# Patient Record
Sex: Female | Born: 1955 | Race: White | Hispanic: No | Marital: Married | Smoking: Never smoker
Health system: Southern US, Community
[De-identification: ages and names within clinical notes are randomized; demographics above are authoritative.]

## PROBLEM LIST (undated history)

## (undated) DIAGNOSIS — K219 Gastro-esophageal reflux disease without esophagitis: Secondary | ICD-10-CM

## (undated) DIAGNOSIS — E611 Iron deficiency: Secondary | ICD-10-CM

## (undated) DIAGNOSIS — C801 Malignant (primary) neoplasm, unspecified: Secondary | ICD-10-CM

## (undated) DIAGNOSIS — I10 Essential (primary) hypertension: Secondary | ICD-10-CM

## (undated) DIAGNOSIS — E119 Type 2 diabetes mellitus without complications: Secondary | ICD-10-CM

## (undated) DIAGNOSIS — M1990 Unspecified osteoarthritis, unspecified site: Secondary | ICD-10-CM

## (undated) HISTORY — DX: Gastro-esophageal reflux disease without esophagitis: K21.9

## (undated) HISTORY — PX: BIOPSY ENDOMETRIAL: PRO11

## (undated) HISTORY — PX: BREAST BIOPSY: SHX20

## (undated) HISTORY — DX: Essential (primary) hypertension: I10

## (undated) HISTORY — PX: CRYOTHERAPY: SHX1416

## (undated) HISTORY — DX: Unspecified osteoarthritis, unspecified site: M19.90

## (undated) HISTORY — DX: Iron deficiency: E61.1

## (undated) HISTORY — PX: DILATION AND CURETTAGE OF UTERUS: SHX78

## (undated) HISTORY — PX: ABDOMINAL HYSTERECTOMY: SHX81

## (undated) HISTORY — PX: NASAL SINUS SURGERY: SHX719

## (undated) HISTORY — PX: KNEE SURGERY: SHX244

## (undated) HISTORY — DX: Type 2 diabetes mellitus without complications: E11.9

---

## 2006-05-01 ENCOUNTER — Emergency Department: Payer: Self-pay

## 2006-12-13 ENCOUNTER — Ambulatory Visit: Payer: Self-pay

## 2007-01-12 ENCOUNTER — Ambulatory Visit: Payer: Self-pay | Admitting: Unknown Physician Specialty

## 2007-01-16 ENCOUNTER — Ambulatory Visit: Payer: Self-pay | Admitting: Unknown Physician Specialty

## 2007-06-18 ENCOUNTER — Ambulatory Visit: Payer: Self-pay | Admitting: Gastroenterology

## 2007-07-14 ENCOUNTER — Ambulatory Visit: Payer: Self-pay | Admitting: Emergency Medicine

## 2010-04-24 ENCOUNTER — Ambulatory Visit: Payer: Self-pay | Admitting: Internal Medicine

## 2010-08-26 ENCOUNTER — Ambulatory Visit: Payer: Self-pay | Admitting: Otolaryngology

## 2012-01-10 ENCOUNTER — Ambulatory Visit: Payer: Self-pay | Admitting: Family Medicine

## 2012-08-07 ENCOUNTER — Ambulatory Visit: Payer: Self-pay | Admitting: Family Medicine

## 2012-08-16 ENCOUNTER — Ambulatory Visit: Payer: Self-pay | Admitting: Emergency Medicine

## 2012-12-07 ENCOUNTER — Ambulatory Visit: Payer: Self-pay | Admitting: Internal Medicine

## 2013-10-12 ENCOUNTER — Ambulatory Visit: Payer: Self-pay | Admitting: Unknown Physician Specialty

## 2013-10-29 DIAGNOSIS — M23229 Derangement of posterior horn of medial meniscus due to old tear or injury, unspecified knee: Secondary | ICD-10-CM | POA: Insufficient documentation

## 2013-10-29 HISTORY — DX: Derangement of posterior horn of medial meniscus due to old tear or injury, unspecified knee: M23.229

## 2013-11-22 DIAGNOSIS — I10 Essential (primary) hypertension: Secondary | ICD-10-CM | POA: Insufficient documentation

## 2013-11-22 HISTORY — DX: Essential (primary) hypertension: I10

## 2014-03-15 ENCOUNTER — Emergency Department: Payer: Self-pay | Admitting: Student

## 2014-04-11 ENCOUNTER — Ambulatory Visit: Payer: Self-pay | Admitting: Family Medicine

## 2014-09-02 DIAGNOSIS — Z86006 Personal history of melanoma in-situ: Secondary | ICD-10-CM

## 2014-09-02 HISTORY — DX: Personal history of melanoma in-situ: Z86.006

## 2014-11-21 DIAGNOSIS — Z85820 Personal history of malignant melanoma of skin: Secondary | ICD-10-CM | POA: Insufficient documentation

## 2014-11-21 HISTORY — DX: Personal history of malignant melanoma of skin: Z85.820

## 2015-01-26 DIAGNOSIS — Z86018 Personal history of other benign neoplasm: Secondary | ICD-10-CM

## 2015-01-26 HISTORY — DX: Personal history of other benign neoplasm: Z86.018

## 2015-03-06 DIAGNOSIS — K222 Esophageal obstruction: Secondary | ICD-10-CM

## 2015-03-06 HISTORY — DX: Esophageal obstruction: K22.2

## 2015-07-22 IMAGING — CR DG KNEE COMPLETE 4+V*L*
1 series · 4 of 4 positions shown · non-contrast
Comparison: None.

CLINICAL DATA: Left knee pain after fall.

EXAM:
LEFT KNEE - COMPLETE 4+ VIEW

[Series 1: t knee ap left · 0.14mm/px · 4 of 4 slices shown]
[im 1/4]
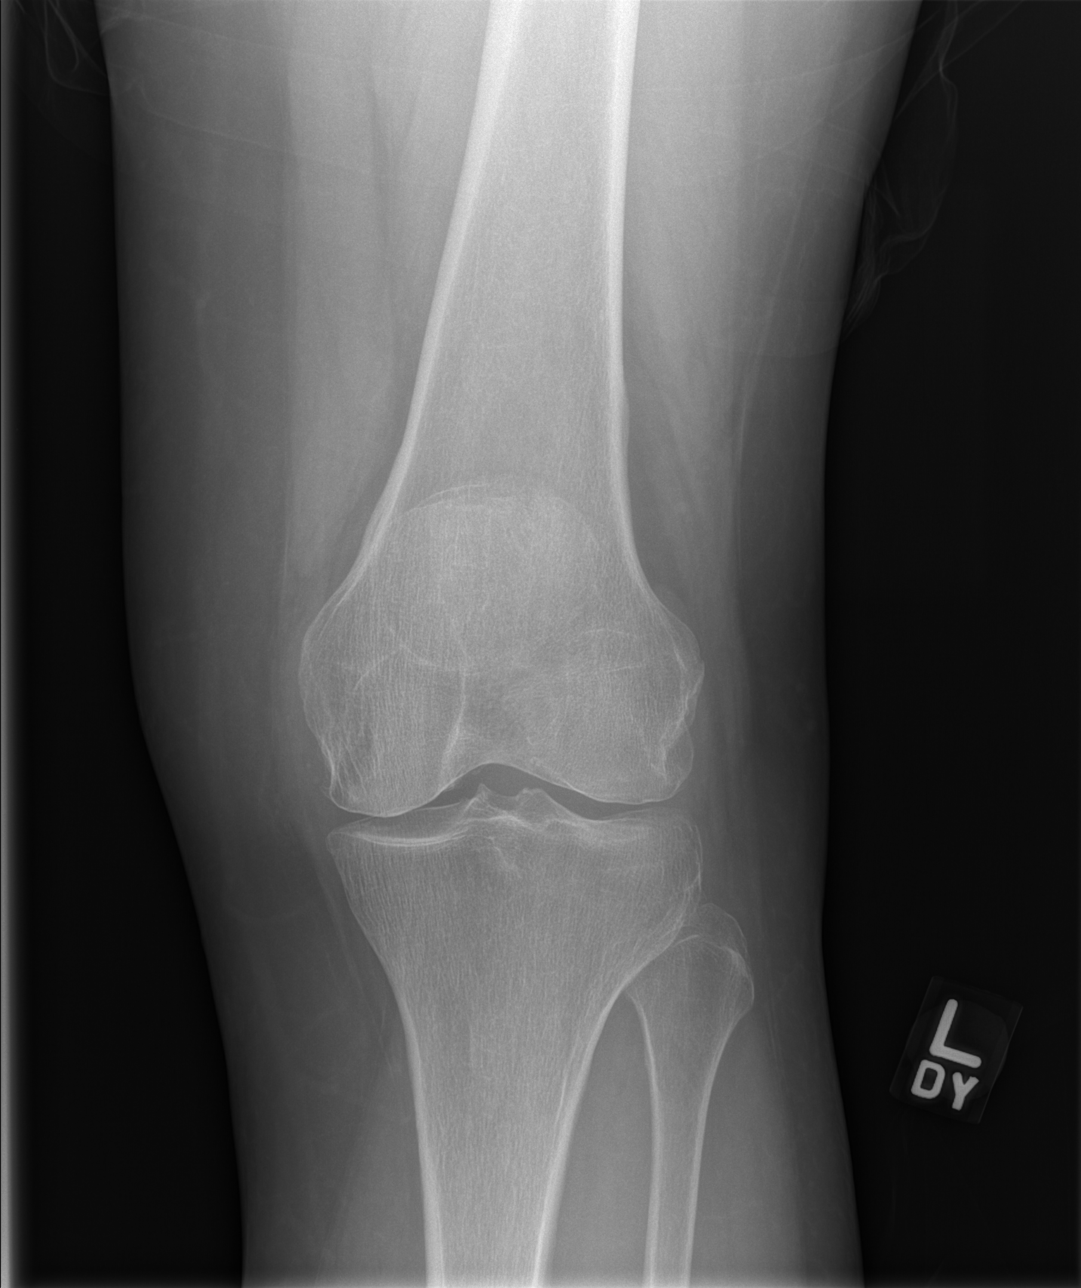
[im 2/4]
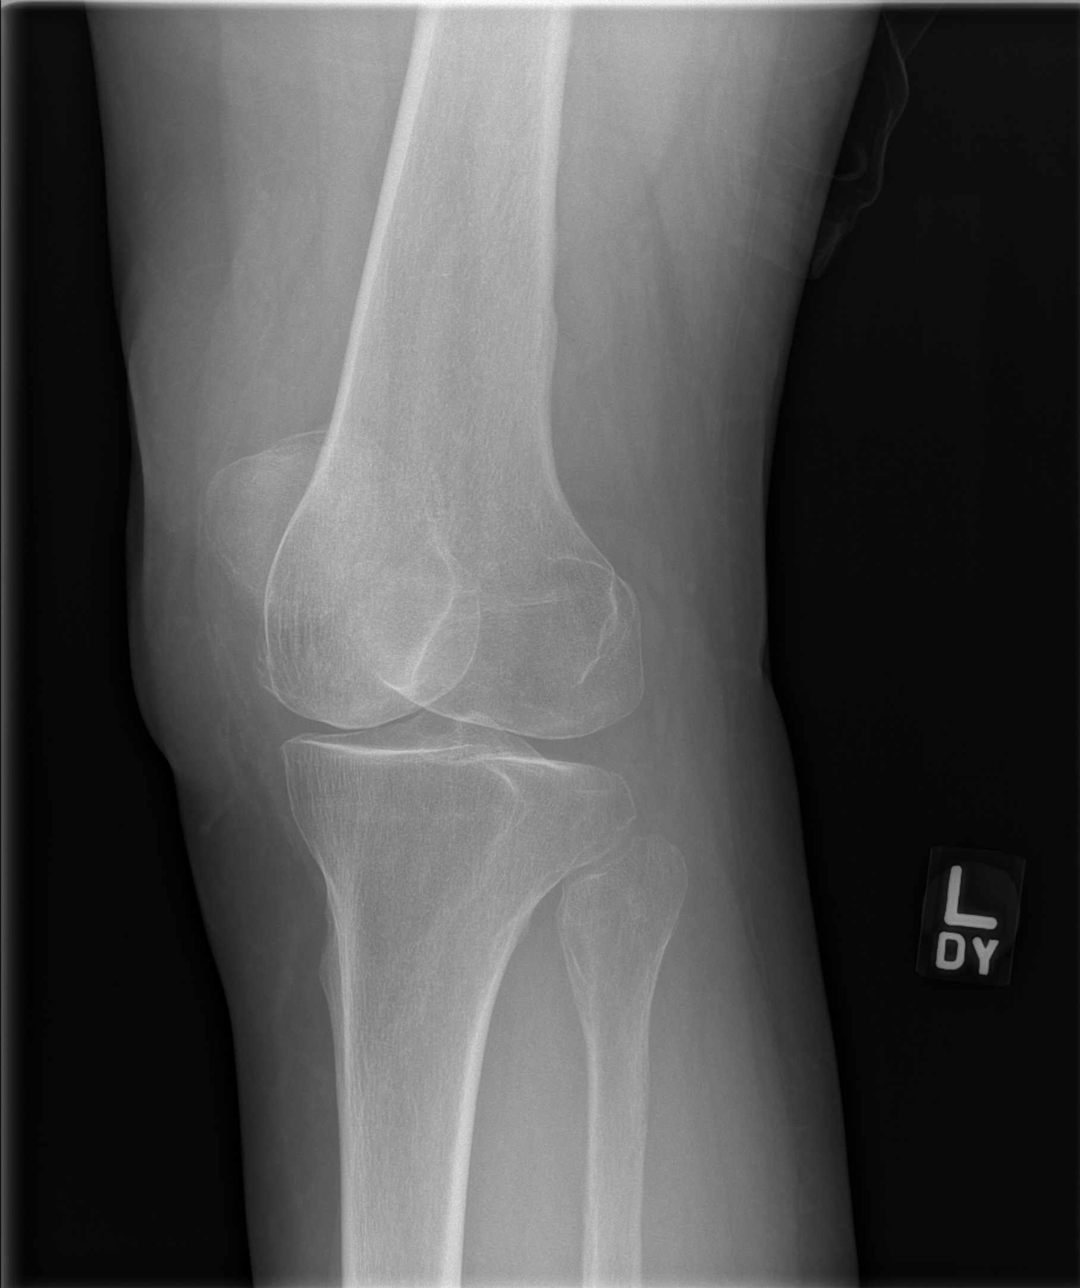
[im 3/4]
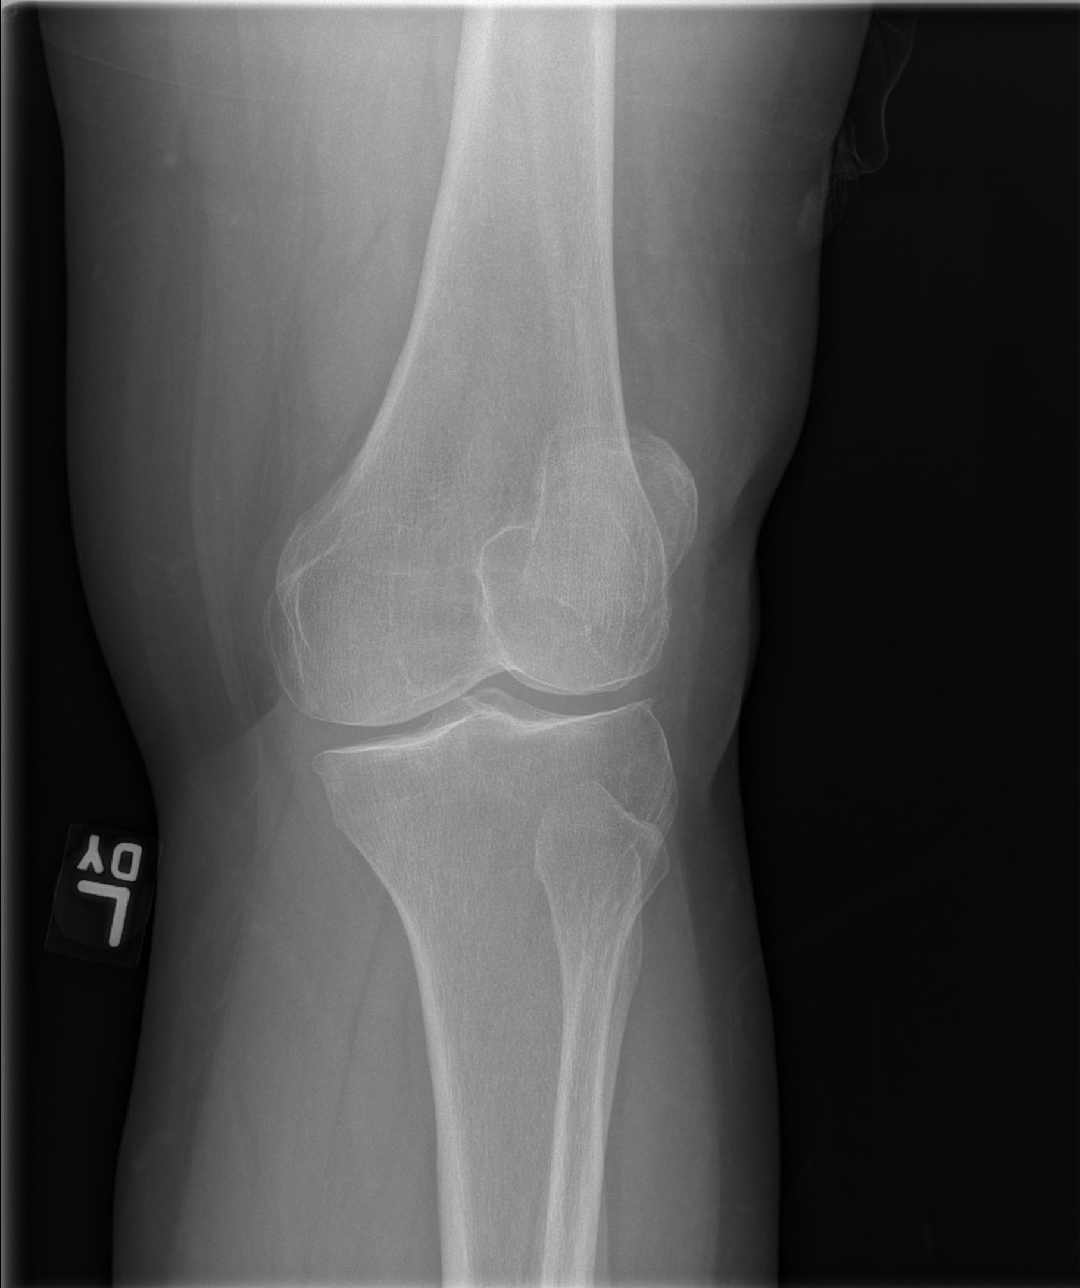
[im 4/4]
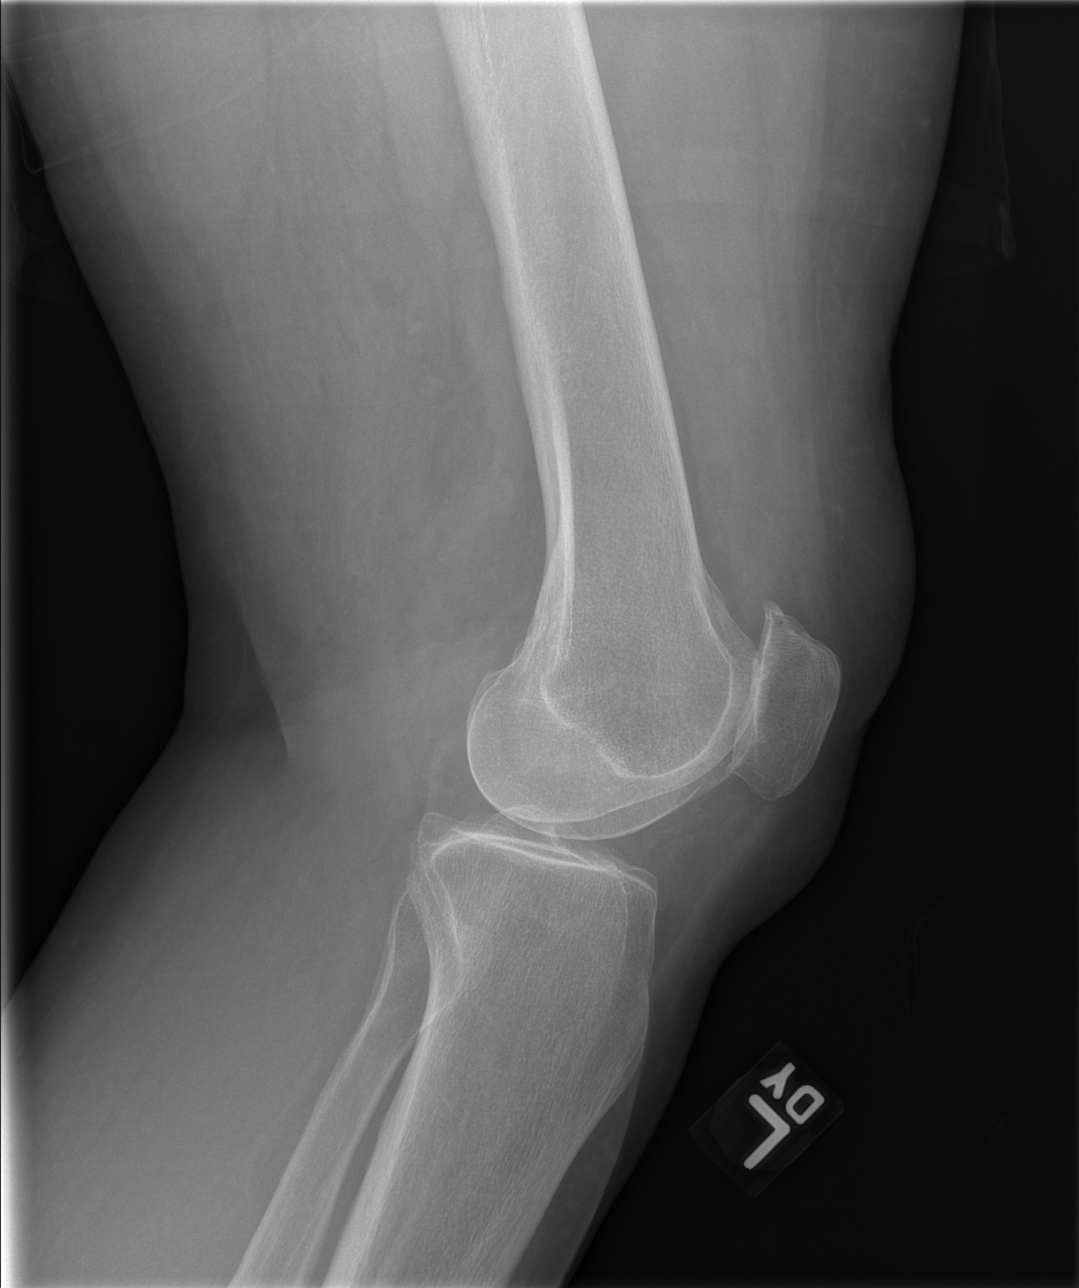

[4 of 4 positions shown; findings below may reference images not displayed]

FINDINGS: There is no evidence of fracture, dislocation, or joint effusion.
Mild narrowing of medial joint space is noted. Soft tissues are
unremarkable.
IMPRESSION: Mild degenerative joint disease is noted medially. No acute
abnormality seen in the left knee.

## 2015-12-15 DIAGNOSIS — E119 Type 2 diabetes mellitus without complications: Secondary | ICD-10-CM

## 2015-12-15 HISTORY — DX: Type 2 diabetes mellitus without complications: E11.9

## 2017-05-23 ENCOUNTER — Other Ambulatory Visit: Payer: Self-pay | Admitting: Family Medicine

## 2017-05-23 DIAGNOSIS — Z1231 Encounter for screening mammogram for malignant neoplasm of breast: Secondary | ICD-10-CM

## 2017-05-24 ENCOUNTER — Encounter: Payer: Self-pay | Admitting: Obstetrics & Gynecology

## 2017-05-24 ENCOUNTER — Ambulatory Visit (INDEPENDENT_AMBULATORY_CARE_PROVIDER_SITE_OTHER): Payer: PRIVATE HEALTH INSURANCE | Admitting: Obstetrics & Gynecology

## 2017-05-24 ENCOUNTER — Telehealth: Payer: Self-pay | Admitting: Obstetrics & Gynecology

## 2017-05-24 VITALS — BP 130/80 | HR 78 | Ht 63.0 in | Wt 201.0 lb

## 2017-05-24 DIAGNOSIS — Z124 Encounter for screening for malignant neoplasm of cervix: Secondary | ICD-10-CM

## 2017-05-24 DIAGNOSIS — Z1231 Encounter for screening mammogram for malignant neoplasm of breast: Secondary | ICD-10-CM | POA: Diagnosis not present

## 2017-05-24 DIAGNOSIS — N9089 Other specified noninflammatory disorders of vulva and perineum: Secondary | ICD-10-CM

## 2017-05-24 DIAGNOSIS — Z0000 Encounter for general adult medical examination without abnormal findings: Secondary | ICD-10-CM | POA: Diagnosis not present

## 2017-05-24 DIAGNOSIS — Z1239 Encounter for other screening for malignant neoplasm of breast: Secondary | ICD-10-CM

## 2017-05-24 NOTE — Patient Instructions (Signed)
PAP every three years Mammogram every year    Call [phone] to schedule at [facility] or [place] Colonoscopy every 10 years Labs yearly (with PCP)   Vulva Biopsy, Care After These instructions give you information about caring for yourself after your procedure. Your doctor may also give you more specific instructions. Call your doctor if you have any problems or questions after your procedure. Follow these instructions at home: Biopsy Site Care   Do not rub the biopsy area after peeing (urinating). Gently: ? Pat the area dry. Or, use a bottle filled with warm water (peri-bottle) to clean the area. ? Wipe from front to back.  Follow instructions from your doctor about how to take care of your biopsy site. Make sure you: ? Clean the area using water and mild soap twice a day or as told by your doctor. Gently pat the area dry. ? If you were prescribed an antibiotic medical ointment, apply it as told by your doctor. Do not stop using the antibiotic even if your condition gets better. ? Take a warm water bath that is taken while you are sitting down (sitz bath). Do this as needed to help with pain. ? Leave stitches (sutures), skin glue, or skin tape (adhesive) strips in place. They may need to stay in place for 2 weeks or longer. If tape strips get loose and curl up, you may trim the loose edges. Do not remove tape strips completely unless your doctor says it is okay.  Check your biopsy site every day for signs of infection. Check for: ? More redness, swelling, or pain. ? More fluid or blood. ? Warmth. ? Pus or a bad smell. Lifestyle  Wear loose, cotton underwear.  Do not wear tight pants.  Do not use a tampon, douche, or put anything in your vagina for at least one week or until your doctor says it is okay.  Do not have sex for at least one week or until your doctor says it is okay.  Do not exercise until your doctor says it is okay.  Do not take baths, swim, or use a hot tub  until your doctor says it is okay. General instructions  Take over-the-counter and prescription medicines only as told by your doctor.  Use a sanitary pad until bleeding stops.  Keep all follow-up visits as told by your doctor. This is important.  If the sample is being sent for testing, it is your responsibility to get the results of your procedure. Ask your doctor or the department doing the procedure when your results will be ready. Contact a doctor if:  You have more redness, swelling, or pain around your biopsy site.  You have more fluid or blood coming from your biopsy site.  Your biopsy site feels warm when you touch it.  Medicine does not help your pain. Get help right away if:  You have a lot of bleeding from the vulva.  You have pus or a bad smell coming from your biopsy site.  You have a fever.  You have lower belly pain. This information is not intended to replace advice given to you by your health care provider. Make sure you discuss any questions you have with your health care provider. Document Released: 09/02/2008 Document Revised: 11/12/2015 Document Reviewed: 04/27/2015 Elsevier Interactive Patient Education  [employer].

## 2017-05-24 NOTE — Progress Notes (Signed)
HPI:      Lindsay Wallace is a 61 y.o. G2P1011 who LMP was in the past (menopause and prior [facility]), she presents today for her annual examination.  The patient has no complaints today other than vulvar irritation and itching. The patient is not sexually active. [name] pap: approximate date 2016 and was normal and last mammogram: approximate date 2017 and was normal.  The patient does perform self breast exams.  There is no notable family history of breast or ovarian cancer in her family. The patient is not taking hormone replacement therapy. Patient denies post-menopausal vaginal bleeding.   The patient has regular exercise: yes. The patient denies current symptoms of depression.    Pt has sx's og vulvar irritation and sensitivity at times.  No VB.  Prior [facility].  GYN Hx: Last Colonoscopy:10 years ago. Normal.  Last DEXA: 4 years ago.    PMHx: Past Medical History:  Diagnosis Date  . Arthritis   . Diabetes mellitus without complication ([place])   . Esophageal reflux   . Hypertension   . Iron deficiency   . Iron deficiency    Past Surgical History:  Procedure Laterality Date  . ABDOMINAL HYSTERECTOMY    . BIOPSY ENDOMETRIAL    . CESAREAN SECTION    . CRYOTHERAPY    . DILATION AND CURETTAGE OF UTERUS    . KNEE SURGERY    . NASAL SINUS SURGERY     Family History  Problem Relation Age of Onset  . COPD Mother   . Osteoporosis Mother   . Emphysema Father   . Brain cancer Sister   . Lung cancer Sister   . Heart attack Sister   . Osteoporosis Sister   . Brain cancer Brother   . Lung cancer Brother   . Osteoporosis Brother   . Osteoporosis Maternal Aunt    Social History   Tobacco Use  . Smoking status: Never Smoker  . Smokeless tobacco: Never Used  Substance Use Topics  . Alcohol use: No    Frequency: Never  . Drug use: No   No current outpatient medications on file. Allergies: Codeine; Hydrocodone; Other; Bupropion; Eggs or egg-derived products; and  Moxifloxacin  Review of Systems  Constitutional: Negative for chills, fever and malaise/fatigue.  HENT: Negative for congestion, sinus pain and sore throat.   Eyes: Negative for blurred vision and pain.  Respiratory: Negative for cough and wheezing.   Cardiovascular: Negative for chest pain and leg swelling.  Gastrointestinal: Negative for abdominal pain, constipation, diarrhea, heartburn, nausea and vomiting.  Genitourinary: Negative for dysuria, frequency, hematuria and urgency.  Musculoskeletal: Negative for back pain, joint pain, myalgias and neck pain.  Skin: Negative for itching and rash.  Neurological: Negative for dizziness, tremors and weakness.  Endo/Heme/Allergies: Does not bruise/bleed easily.  Psychiatric/Behavioral: Negative for depression. The patient is not nervous/anxious and does not have insomnia.     Objective: BP 130/80   Pulse 78   Ht 5\' 3"  (1.6 m)   Wt 201 lb (91.2 kg)   BMI 35.61 kg/m   Filed Weights   05/24/17 1407  Weight: 201 lb (91.2 kg)   Body mass index is 35.61 kg/m. Physical Exam  Constitutional: She is oriented to person, place, and time. She appears well-developed and well-nourished. No distress.  Genitourinary: Rectum normal and vagina normal. Pelvic exam was performed with patient supine. There is no rash or lesion on the right labia. There is no rash or lesion on the left labia.  Vagina exhibits no lesion. No bleeding in the vagina. Right adnexum does not display mass and does not display tenderness. Left adnexum does not display mass and does not display tenderness. Cervix does not exhibit motion tenderness, lesion, friability or polyp.  Genitourinary Comments: Absent uterus Ext Vulvar pale while skin, no excoriations  HENT:  Head: Normocephalic and atraumatic. Head is without laceration.  Right Ear: Hearing normal.  Left Ear: Hearing normal.  Nose: No epistaxis.  No foreign bodies.  Mouth/Throat: Uvula is midline, oropharynx is clear  and moist and mucous membranes are normal.  Eyes: Pupils are equal, round, and reactive to light.  Neck: Normal range of motion. Neck supple. No thyromegaly present.  Cardiovascular: Normal rate and regular rhythm. Exam reveals no gallop and no friction rub.  No murmur heard. Pulmonary/Chest: Effort normal and breath sounds normal. No respiratory distress. She has no wheezes. Right breast exhibits no mass, no skin change and no tenderness. Left breast exhibits no mass, no skin change and no tenderness.  Abdominal: Soft. Bowel sounds are normal. She exhibits no distension. There is no tenderness. There is no rebound.  Musculoskeletal: Normal range of motion.  Neurological: She is alert and oriented to person, place, and time. No cranial nerve deficit.  Skin: Skin is warm and dry.  Psychiatric: She has a normal mood and affect. Judgment normal.  Vitals reviewed.   Assessment: Annual Exam 1. Annual physical exam   2. Vulvar lesion   3. Screening for cervical cancer   4. Screening for breast cancer    VULVAR BIOPSY NOTE The indications for vulvar biopsy (rule out neoplasia, establish lichen sclerosus diagnosis) were reviewed.   Risks of the biopsy including pain, bleeding, infection, inadequate specimen, scarring and need for additional procedures  were discussed. The patient stated understanding and agreed to undergo procedure today. Consent was signed,  time out performed.   The patient's vulva was prepped with Betadine. 1% lidocaine was injected into area of concern. A 3 -mm punch biopsy was done, biopsy tissue was picked up with sterile forceps and sterile scissors were used to excise the lesion.  Small bleeding was noted and hemostasis was achieved using silver nitrate sticks.  The patient tolerated the procedure well. Post-procedure instructions  (pelvic rest for one week) were given to the patient. The patient is to call with heavy bleeding, fever greater than 100.4, foul smelling vaginal  discharge or other concerns.   Plan:            1.  Cervical Screening-  Pap smear done today, Pap smear schedule reviewed with patient  2. Breast screening- Exam annually and mammogram scheduled (already scheduled at [place])  3. Colonoscopy every 10 years and will schedule soon with Dr Allen Norris, Hemoccult testing after age 89  4. Labs managed by PCP  5. Counseling for hormonal therapy: none, no change in therapy today  6. Vulvar skin changes, possible Lichen, VIN, Pagets.  Biopsy done.     F/U  Return in about 1 year (around 05/24/2018) for Annual.  Barnett Applebaum, MD, [facility], [facility] 05/24/2017  2:48 PM

## 2017-05-24 NOTE — Telephone Encounter (Signed)
Pt is calling about her the medication she is suppose to use for her Biopsy. Please advise

## 2017-05-25 ENCOUNTER — Telehealth: Payer: Self-pay | Admitting: Obstetrics & Gynecology

## 2017-05-25 NOTE — Telephone Encounter (Signed)
Patient doesn't want to wait for the biopsy results for you to come back from vacation.  Can someone else give her the results?

## 2017-05-25 NOTE — Telephone Encounter (Signed)
Yes, if it is not back when I check tomorrow then it can be forwarded to another doctor to give her results

## 2017-05-26 LAB — PATHOLOGY

## 2017-05-26 LAB — IGP, APTIMA HPV
HPV APTIMA: NEGATIVE
PAP Smear Comment: 0

## 2017-06-01 ENCOUNTER — Encounter: Payer: Self-pay | Admitting: Radiology

## 2017-06-01 ENCOUNTER — Ambulatory Visit
Admission: RE | Admit: 2017-06-01 | Discharge: 2017-06-01 | Disposition: A | Discharge status: Home / Self Care | Payer: PRIVATE HEALTH INSURANCE | Source: Ambulatory Visit | Attending: Family Medicine | Admitting: Family Medicine

## 2017-06-01 DIAGNOSIS — Z1231 Encounter for screening mammogram for malignant neoplasm of breast: Secondary | ICD-10-CM | POA: Diagnosis not present

## 2017-06-01 DIAGNOSIS — R928 Other abnormal and inconclusive findings on diagnostic imaging of breast: Secondary | ICD-10-CM | POA: Diagnosis not present

## 2017-06-01 HISTORY — DX: Malignant (primary) neoplasm, unspecified: C80.1

## 2017-06-05 ENCOUNTER — Telehealth: Payer: Self-pay | Admitting: Obstetrics & Gynecology

## 2017-06-05 NOTE — Progress Notes (Signed)
LM.  Pt to call back, discuss results then of Lichen Sclerosus.

## 2017-06-05 NOTE — Progress Notes (Signed)
Pt had called; I called back and no answer again. LM

## 2017-06-05 NOTE — Telephone Encounter (Signed)
Pt is returning missed call from Dr. Kenton Kingfisher. Please advise

## 2017-06-06 ENCOUNTER — Other Ambulatory Visit: Payer: Self-pay | Admitting: Obstetrics & Gynecology

## 2017-06-06 MED ORDER — CLOBETASOL PROPIONATE 0.05 % EX CREA: 1.0000 "application " | TOPICAL_CREAM | Freq: Two times a day (BID) | CUTANEOUS | 1 refills | Status: DC

## 2017-06-06 NOTE — Telephone Encounter (Signed)
Gae Dry, MD  Georgana Curio E        Please schedule appt in one month for follow up [facility]); pt awaiting call    Called and schedule patient 07/07/16 with Dr. Kenton Kingfisher for follow up

## 2017-06-06 NOTE — Progress Notes (Signed)
Please schedule appt in one month for follow up [facility]); pt awaiting call

## 2017-06-19 ENCOUNTER — Inpatient Hospital Stay
Admission: RE | Admit: 2017-06-19 | Discharge: 2017-06-19 | Disposition: A | Discharge status: Home / Self Care | Payer: Self-pay | Source: Ambulatory Visit | Attending: *Deleted | Admitting: *Deleted

## 2017-06-19 ENCOUNTER — Other Ambulatory Visit: Payer: Self-pay | Admitting: *Deleted

## 2017-06-19 DIAGNOSIS — Z9289 Personal history of other medical treatment: Secondary | ICD-10-CM

## 2017-06-22 ENCOUNTER — Other Ambulatory Visit: Payer: Self-pay | Admitting: Family Medicine

## 2017-06-22 DIAGNOSIS — N6489 Other specified disorders of breast: Secondary | ICD-10-CM

## 2017-06-22 DIAGNOSIS — R928 Other abnormal and inconclusive findings on diagnostic imaging of breast: Secondary | ICD-10-CM

## 2017-06-23 ENCOUNTER — Other Ambulatory Visit: Payer: Self-pay | Admitting: Obstetrics & Gynecology

## 2017-06-23 MED ORDER — CLOBETASOL PROPIONATE 0.05 % EX CREA: 1.0000 "application " | TOPICAL_CREAM | Freq: Two times a day (BID) | CUTANEOUS | 1 refills | Status: DC

## 2017-06-28 ENCOUNTER — Other Ambulatory Visit: Payer: Self-pay | Admitting: Obstetrics & Gynecology

## 2017-06-28 MED ORDER — MOMETASONE FUROATE 0.1 % EX OINT: TOPICAL_OINTMENT | CUTANEOUS | 3 refills | Status: DC

## 2017-06-29 ENCOUNTER — Ambulatory Visit
Admission: RE | Admit: 2017-06-29 | Discharge: 2017-06-29 | Disposition: A | Discharge status: Home / Self Care | Payer: PRIVATE HEALTH INSURANCE | Source: Ambulatory Visit | Attending: Family Medicine | Admitting: Family Medicine

## 2017-06-29 DIAGNOSIS — N6489 Other specified disorders of breast: Secondary | ICD-10-CM | POA: Diagnosis present

## 2017-06-29 DIAGNOSIS — R928 Other abnormal and inconclusive findings on diagnostic imaging of breast: Secondary | ICD-10-CM

## 2017-07-07 ENCOUNTER — Ambulatory Visit: Payer: PRIVATE HEALTH INSURANCE | Admitting: Obstetrics & Gynecology

## 2017-07-26 ENCOUNTER — Ambulatory Visit: Payer: PRIVATE HEALTH INSURANCE | Admitting: Obstetrics & Gynecology

## 2017-08-15 ENCOUNTER — Ambulatory Visit: Payer: PRIVATE HEALTH INSURANCE | Admitting: Obstetrics & Gynecology

## 2017-09-21 ENCOUNTER — Ambulatory Visit (INDEPENDENT_AMBULATORY_CARE_PROVIDER_SITE_OTHER): Payer: PRIVATE HEALTH INSURANCE | Admitting: Obstetrics & Gynecology

## 2017-09-21 ENCOUNTER — Encounter: Payer: Self-pay | Admitting: Obstetrics & Gynecology

## 2017-09-21 VITALS — BP 100/60 | Ht 63.0 in | Wt 199.0 lb

## 2017-09-21 DIAGNOSIS — L900 Lichen sclerosus et atrophicus: Secondary | ICD-10-CM

## 2017-09-21 NOTE — Progress Notes (Signed)
  History of Present Illness:  Lindsay Wallace is a 62 y.o. who was started on steroid cream to vulva due to dx of Lichen Sclerosus by exam and bx approximately 3 months ago. Since that time, she states that her symptoms are improving.  She denies dryness and itching.  She is not sexually active.  She has been using the cream daily.  PMHx: She  has a past medical history of Arthritis, Cancer ([place]), Diabetes mellitus without complication ([place]), Esophageal reflux, Hypertension, Iron deficiency, and Iron deficiency. Also,  has a past surgical history that includes Knee surgery; Cesarean section; Dilation and curettage of uterus; Biopsy endometrial; Cryotherapy; Abdominal hysterectomy; Nasal sinus surgery; and Breast biopsy (Left)., family history includes Brain cancer in her brother and sister; COPD in her mother; Emphysema in her father; Heart attack in her sister; Lung cancer in her brother and sister; Osteoporosis in her brother, maternal aunt, mother, and sister.,  reports that she has never smoked. She has never used smokeless tobacco. She reports that she does not drink alcohol or use drugs.  Current Outpatient Medications:  .  mometasone (ELOCON) 0.1 % ointment, Apply topical to affected areas twice daily for 2 weeks per episode, Disp: 45 g, Rfl: 3  Also, is allergic to codeine; hydrocodone; other; bupropion; eggs or egg-derived products; and moxifloxacin..  Review of Systems  All other systems reviewed and are negative.  Physical Exam:  BP 100/60   Ht 5\' 3"  (1.6 m)   Wt 199 lb (90.3 kg)   BMI 35.25 kg/m  Body mass index is 35.25 kg/m. Physical Exam  Constitutional: She is oriented to person, place, and time. She appears well-developed and well-nourished. No distress.  Genitourinary: Pelvic exam was performed with patient supine. There is no rash, tenderness or lesion on the right labia. There is no rash, tenderness or lesion on the left labia. Vagina exhibits no lesion. No erythema or  bleeding in the vagina.  Genitourinary Comments: Pale vulva, no lesions Vag atrophy noted  Abdominal: Soft. She exhibits no distension. There is no tenderness.  Musculoskeletal: Normal range of motion.  Neurological: She is alert and oriented to person, place, and time. No cranial nerve deficit.  Skin: Skin is warm and dry.  Psychiatric: She has a normal mood and affect.   Assessment:  Problem List Items Addressed This Visit      Musculoskeletal and Integument   Lichen sclerosus - Primary     Medication treatment is going well for her Lichen.  Plan: She will undergo change in her medical therapy.  She will stop medicine and use when she has flare ups only; unless flare ups are frequent.  She will keep diary of sx's.  Pros and cons of chronic therapy with steroids discussed.  She was amenable to this plan and we will see her back for annual/PRN.  Barnett Applebaum, MD, [facility], [facility] 09/21/2017  9:18 AM

## 2018-09-14 ENCOUNTER — Ambulatory Visit: Payer: Self-pay | Admitting: Obstetrics & Gynecology

## 2019-04-03 DIAGNOSIS — E7800 Pure hypercholesterolemia, unspecified: Secondary | ICD-10-CM | POA: Insufficient documentation

## 2019-04-03 HISTORY — DX: Pure hypercholesterolemia, unspecified: E78.00

## 2020-05-01 ENCOUNTER — Telehealth: Payer: Self-pay

## 2020-05-01 ENCOUNTER — Other Ambulatory Visit: Payer: Self-pay | Admitting: Obstetrics & Gynecology

## 2020-05-01 DIAGNOSIS — Z1231 Encounter for screening mammogram for malignant neoplasm of breast: Secondary | ICD-10-CM

## 2020-05-01 NOTE — Telephone Encounter (Signed)
Patient has apt for AE w/RPH 06/2020. Requesting order for mammogram so she can schedule apt. GC#[phone]

## 2020-05-01 NOTE — Telephone Encounter (Signed)
Done (overdue for MMG as well as Annual)

## 2020-05-01 NOTE — Telephone Encounter (Signed)
LMVM to notify patient order placed.

## 2020-06-24 ENCOUNTER — Other Ambulatory Visit: Payer: Self-pay

## 2020-06-24 ENCOUNTER — Ambulatory Visit (INDEPENDENT_AMBULATORY_CARE_PROVIDER_SITE_OTHER): Payer: PRIVATE HEALTH INSURANCE | Admitting: Obstetrics & Gynecology

## 2020-06-24 ENCOUNTER — Encounter: Payer: Self-pay | Admitting: Obstetrics & Gynecology

## 2020-06-24 ENCOUNTER — Other Ambulatory Visit (HOSPITAL_COMMUNITY)
Admission: RE | Admit: 2020-06-24 | Discharge: 2020-06-24 | Disposition: A | Discharge status: Home / Self Care | Payer: PRIVATE HEALTH INSURANCE | Source: Ambulatory Visit | Attending: Obstetrics & Gynecology | Admitting: Obstetrics & Gynecology

## 2020-06-24 VITALS — BP 122/62 | HR 85 | Ht 63.0 in | Wt 205.8 lb

## 2020-06-24 DIAGNOSIS — Z124 Encounter for screening for malignant neoplasm of cervix: Secondary | ICD-10-CM

## 2020-06-24 DIAGNOSIS — L900 Lichen sclerosus et atrophicus: Secondary | ICD-10-CM

## 2020-06-24 DIAGNOSIS — Z01419 Encounter for gynecological examination (general) (routine) without abnormal findings: Secondary | ICD-10-CM

## 2020-06-24 DIAGNOSIS — Z1211 Encounter for screening for malignant neoplasm of colon: Secondary | ICD-10-CM | POA: Insufficient documentation

## 2020-06-24 DIAGNOSIS — Z1231 Encounter for screening mammogram for malignant neoplasm of breast: Secondary | ICD-10-CM

## 2020-06-24 MED ORDER — CLOBETASOL PROPIONATE 0.05 % EX OINT: TOPICAL_OINTMENT | CUTANEOUS | 5 refills | Status: DC

## 2020-06-24 NOTE — Patient Instructions (Addendum)
PAP every three years Mammogram every year    Call [phone] to schedule at [facility] Colonoscopy every 10 years or Cologuard every 3 years Labs yearly (with PCP)  Thank you for choosing [facility]. As part of our ongoing efforts to improve patient experience, we would appreciate your feedback. Please fill out the short survey that you will receive by mail or MyChart. Your opinion is important to [place]! - Dr. Tiburcio Pea

## 2020-06-24 NOTE — Progress Notes (Signed)
HPI:      [name] is a 65 y.o. G2P1011 who LMP was in the past, she presents today for her annual examination.  The patient has no complaints today.  No exacerbations w LS.  Prior [facility].   The patient is not currently sexually active. [name] pap: approximate date 2018 and was normal and last mammogram: approximate date 2019 and was normal.  The patient does perform self breast exams.  There is no notable family history of breast or ovarian cancer in her family. The patient is not taking hormone replacement therapy. Patient denies post-menopausal vaginal bleeding.   The patient has regular exercise: yes. The patient denies current symptoms of depression.    GYN Hx: Last Colonoscopy:10 years ago. Normal. Planning Cologuard thru PCP Last DEXA: several years ago.    PMHx: Past Medical History:  Diagnosis Date  . Arthritis   . Cancer ([facility])    skin ca  . Diabetes mellitus without complication ([facility])   . Esophageal reflux   . Hypertension   . Iron deficiency   . Iron deficiency    Past Surgical History:  Procedure Laterality Date  . ABDOMINAL HYSTERECTOMY    . BIOPSY ENDOMETRIAL    . BREAST BIOPSY Left    needle bx-neg  . CESAREAN SECTION    . CRYOTHERAPY    . DILATION AND CURETTAGE OF UTERUS    . KNEE SURGERY    . NASAL SINUS SURGERY     Family History  Problem Relation Age of Onset  . COPD Mother   . Osteoporosis Mother   . Emphysema Father   . Brain cancer Sister   . Lung cancer Sister   . Heart attack Sister   . Osteoporosis Sister   . Brain cancer Brother   . Lung cancer Brother   . Osteoporosis Brother   . Osteoporosis Maternal Aunt   . Breast cancer Neg Hx    Social History   Tobacco Use  . Smoking status: Never Smoker  . Smokeless tobacco: Never Used  Substance Use Topics  . Alcohol use: No  . Drug use: No    Current Outpatient Medications:  .  clobetasol ointment (TEMOVATE) 0.05 %, Apply to affected area every night for 2 weeks as needed for  symptoms related to lichen, Disp: 30 g, Rfl: 5 .  mometasone (ELOCON) 0.1 % ointment, Apply topical to affected areas twice daily for 2 weeks per episode, Disp: 45 g, Rfl: 3 .  pravastatin (PRAVACHOL) 10 MG tablet, Take 10 mg by mouth daily., Disp: , Rfl:  Allergies: Codeine, Hydrocodone, Other, Bupropion, Eggs or egg-derived products, and Moxifloxacin  Review of Systems  Constitutional: Negative for chills, fever and malaise/fatigue.  HENT: Negative for congestion, sinus pain and sore throat.   Eyes: Negative for blurred vision and pain.  Respiratory: Negative for cough and wheezing.   Cardiovascular: Negative for chest pain and leg swelling.  Gastrointestinal: Negative for abdominal pain, constipation, diarrhea, heartburn, nausea and vomiting.  Genitourinary: Negative for dysuria, frequency, hematuria and urgency.  Musculoskeletal: Positive for joint pain. Negative for back pain, myalgias and neck pain.  Skin: Negative for itching and rash.  Neurological: Negative for dizziness, tremors and weakness.  Endo/Heme/Allergies: Does not bruise/bleed easily.  Psychiatric/Behavioral: Positive for depression. The patient is nervous/anxious. The patient does not have insomnia.     Objective: BP 122/62   Pulse 85   Ht 5\' 3"  (1.6 m)   Wt 205 lb 12.8 oz (93.4 kg)  SpO2 96%   BMI 36.46 kg/m   Filed Weights   06/24/20 0801  Weight: 205 lb 12.8 oz (93.4 kg)   Body mass index is 36.46 kg/m. Physical Exam Constitutional:      General: She is not in acute distress.    Appearance: She is well-developed and well-nourished.  Genitourinary:     Vagina and uterus normal.     There is no rash or lesion on the right labia.     There is no rash or lesion on the left labia.    No lesions in the vagina.     Vulva exam comments: Mild effects of LS around vag introisus.        No vaginal bleeding.      Right Adnexa: not tender and no mass present.    Left Adnexa: not tender and no mass present.     No cervical motion tenderness, friability, lesion or polyp.     Uterus is mobile.     Uterus is absent and midaxial.     Pelvic exam was performed with patient supine.  Breasts:     Right: No mass, skin change or tenderness.     Left: No mass, skin change or tenderness.    HENT:     Head: Normocephalic and atraumatic. No laceration.     Right Ear: Hearing normal.     Left Ear: Hearing normal.     Nose: No epistaxis or foreign body.     Mouth/Throat:     Mouth: Oropharynx is clear and moist and mucous membranes are normal.     Pharynx: Uvula midline.  Eyes:     Pupils: Pupils are equal, round, and reactive to light.  Neck:     Thyroid: No thyromegaly.  Cardiovascular:     Rate and Rhythm: Normal rate and regular rhythm.     Heart sounds: No murmur heard. No friction rub. No gallop.   Pulmonary:     Effort: Pulmonary effort is normal. No respiratory distress.     Breath sounds: Normal breath sounds. No wheezing.  Abdominal:     General: Bowel sounds are normal. There is no distension.     Palpations: Abdomen is soft.     Tenderness: There is no abdominal tenderness. There is no rebound.  Musculoskeletal:        General: Normal range of motion.     Cervical back: Normal range of motion and neck supple.  Neurological:     Mental Status: She is alert and oriented to person, place, and time.     Cranial Nerves: No cranial nerve deficit.  Skin:    General: Skin is warm and dry.  Psychiatric:        Mood and Affect: Mood and affect normal.        Judgment: Judgment normal.  Vitals reviewed.     Assessment: Annual Exam 1. Women's annual routine gynecological examination   2. Screening for cervical cancer   3. Encounter for screening mammogram for malignant neoplasm of breast   4. Screen for colon cancer   5. Lichen sclerosus     Plan:            1.  Cervical Screening-  Pap smear done today  2. Breast screening- Exam annually and mammogram scheduled  3.  Colonoscopy every 10 years vs Cologuard (which she plans thru PCP) Hemoccult testing after age 89  4. Labs managed by PCP  5. Counseling for hormonal therapy: none  6. FRAX - FRAX score for assessing the 10 year probability for fracture calculated and discussed today.  Based on age and score today, DEXA is not currently scheduled.   7. Cont PRN use Clobetasol    F/U  Return in about 1 year (around 06/24/2021) for Annual.  Barnett Applebaum, MD, [facility], [facility] 06/24/2020  8:38 AM

## 2020-06-26 LAB — CYTOLOGY - PAP
Comment: NEGATIVE
Diagnosis: NEGATIVE
High risk HPV: NEGATIVE

## 2020-07-28 ENCOUNTER — Encounter: Payer: Self-pay | Admitting: Dietician

## 2020-07-31 ENCOUNTER — Telehealth: Payer: Self-pay

## 2020-07-31 NOTE — Telephone Encounter (Signed)
-----   Message from Gae Dry, MD sent at 07/31/2020  4:24 PM EST ----- Regarding: MMG!! Pt still has not had MMG done.  Please call and encourage her to do so, and even offer to help schedule the MMG if she feels this would help her.  The number is [phone] to schedule at [facility].  Do not let her hang up without a plan to have this done.  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  Thanks for the help.

## 2020-07-31 NOTE — Telephone Encounter (Signed)
Pt states she will schedule her mammo, she was waiting until after she had her covid vaccine, which was done in December

## 2020-08-11 ENCOUNTER — Other Ambulatory Visit: Payer: Self-pay

## 2020-08-11 ENCOUNTER — Ambulatory Visit (INDEPENDENT_AMBULATORY_CARE_PROVIDER_SITE_OTHER): Payer: PRIVATE HEALTH INSURANCE | Admitting: Dermatology

## 2020-08-11 DIAGNOSIS — D229 Melanocytic nevi, unspecified: Secondary | ICD-10-CM

## 2020-08-11 DIAGNOSIS — Z86006 Personal history of melanoma in-situ: Secondary | ICD-10-CM | POA: Diagnosis not present

## 2020-08-11 DIAGNOSIS — D1800 Hemangioma unspecified site: Secondary | ICD-10-CM

## 2020-08-11 DIAGNOSIS — Z86018 Personal history of other benign neoplasm: Secondary | ICD-10-CM

## 2020-08-11 DIAGNOSIS — L821 Other seborrheic keratosis: Secondary | ICD-10-CM

## 2020-08-11 DIAGNOSIS — L570 Actinic keratosis: Secondary | ICD-10-CM

## 2020-08-11 DIAGNOSIS — L659 Nonscarring hair loss, unspecified: Secondary | ICD-10-CM | POA: Diagnosis not present

## 2020-08-11 DIAGNOSIS — L578 Other skin changes due to chronic exposure to nonionizing radiation: Secondary | ICD-10-CM

## 2020-08-11 DIAGNOSIS — L814 Other melanin hyperpigmentation: Secondary | ICD-10-CM

## 2020-08-11 DIAGNOSIS — Z1283 Encounter for screening for malignant neoplasm of skin: Secondary | ICD-10-CM | POA: Diagnosis not present

## 2020-08-11 NOTE — Progress Notes (Signed)
Follow-Up Visit   Subjective  Lindsay Wallace is a 65 y.o. female who presents for the following: TBSE (Patient here for full body skin exam and skin cancer screening. Patient with hx of MMis and dysplastic nevi. ).  Also with concerns about hair loss over last couple of years which is bothersome. She has noticed increased loss and thinner hair.   The following portions of the chart were reviewed this encounter and updated as appropriate:   Tobacco  Allergies  Meds  Problems  Med Hx  Surg Hx  Fam Hx      Review of Systems:  No other skin or systemic complaints except as noted in HPI or Assessment and Plan.  Objective  Well appearing patient in no apparent distress; mood and affect are within normal limits.  A full examination was performed including scalp, head, eyes, ears, nose, lips, neck, chest, axillae, abdomen, back, buttocks, bilateral upper extremities, bilateral lower extremities, hands, feet, fingers, toes, fingernails, and toenails. All findings within normal limits unless otherwise noted below.  Objective  left jaw x 1: Erythematous thin papules/macules with gritty scale.   Objective  Scalp: Diffuse thinning of the crown and widening of the midline part with retention of the frontal hairline  Images     Assessment & Plan  AK (actinic keratosis) left jaw x 1  Prior to procedure, discussed risks of blister formation, small wound, skin dyspigmentation, or rare scar following cryotherapy.    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 Condition is bothersome to patient. Currently  flared.  Patient advises her iron is low and she does take iron and Vitamin D. Patient had labs done with PCP but results are not in Epic.   Recommend over the counter Rogaine (minoxidil) 5% twice a day. Reviewed risk of initial increased hair shedding and facial hypertrichosis. Advised she must continue using it to maintain results.  Discussed oral treatment with prescription low dose finasteride. Pt defers at this time.   Lentigines - Scattered tan macules - Discussed due to sun exposure - Benign, observe - Call for any changes  Seborrheic Keratoses - Stuck-on, waxy, tan-brown papules and plaques  - Discussed benign etiology and prognosis. - Observe - Call for any changes  Melanocytic Nevi - Tan-brown and/or pink-flesh-colored symmetric macules and papules - Benign appearing on exam today - Observation - Call clinic for new or changing moles - Recommend daily use of broad spectrum spf 30+ sunscreen to sun-exposed areas.   Hemangiomas - Red papules - Discussed benign nature - Observe - Call for any changes  Actinic Damage - Chronic, secondary to cumulative UV/sun exposure - diffuse scaly erythematous macules with underlying dyspigmentation - Recommend daily broad spectrum sunscreen SPF 30+ to sun-exposed areas, reapply every 2 hours as needed.  - Call for new or changing lesions.  Skin cancer screening performed today.  History of Melanoma in Situ - No evidence of recurrence today at left lower abdomen - Recommend regular full body skin exams - Recommend daily broad spectrum sunscreen SPF 30+ to sun-exposed areas, reapply every 2 hours as needed.  - Call if any new or changing lesions  are noted between office visits  History of Dysplastic Nevi - No evidence of recurrence today, all have been mild - Recommend regular full body skin exams - Recommend daily broad spectrum sunscreen SPF 30+ to sun-exposed areas, reapply every 2 hours as needed.  - Call if any new or  changing lesions are noted between office visits  Return in about 1 year (around 08/11/2021) for TBSE; 3-4 months recheck AK and hair loss.  Graciella Belton, RMA, am acting as scribe for Forest Gleason, MD .  Documentation: I have reviewed the above documentation for accuracy and completeness, and I agree with the above.  Forest Gleason, MD

## 2020-08-11 NOTE — Patient Instructions (Addendum)
Melanoma ABCDEs  Melanoma is the most dangerous type of skin cancer, and is the leading cause of death from skin disease.  You are more likely to develop melanoma if you:  Have light-colored skin, light-colored eyes, or red or blond hair  Spend a lot of time in the sun  Tan regularly, either outdoors or in a tanning bed  Have had blistering sunburns, especially during childhood  Have a close family member who has had a melanoma  Have atypical moles or large birthmarks  Early detection of melanoma is key since treatment is typically straightforward and cure rates are extremely high if we catch it early.   The first sign of melanoma is often a change in a mole or a new dark spot.  The ABCDE system is a way of remembering the signs of melanoma.  A for asymmetry:  The two halves do not match. B for border:  The edges of the growth are irregular. C for color:  A mixture of colors are present instead of an even brown color. D for diameter:  Melanomas are usually (but not always) greater than 22mm - the size of a pencil eraser. E for evolution:  The spot keeps changing in size, shape, and color.  Please check your skin once per month between visits. You can use a small mirror in front and a large mirror behind you to keep an eye on the back side or your body.   If you see any new or changing lesions before your next follow-up, please call to schedule a visit.  Please continue daily skin protection including broad spectrum sunscreen SPF 30+ to sun-exposed areas, reapplying every 2 hours as needed when you're outdoors.    Cryotherapy Aftercare  . Wash gently with soap and water everyday.   Marland Kitchen Apply Vaseline and Band-Aid daily until healed.  Prior to procedure, discussed risks of blister formation, small wound, skin dyspigmentation, or rare scar following cryotherapy.    Recommend taking Heliocare sun protection supplement daily in sunny weather for additional sun protection. For maximum  protection on the sunniest days, you can take up to 2 capsules of regular Heliocare OR take 1 capsule of Heliocare Ultra. For prolonged exposure (such as a full day in the sun), you can repeat your dose of the supplement 4 hours after your first dose. Heliocare can be purchased at [facility] or at [email or web address].

## 2020-08-26 ENCOUNTER — Other Ambulatory Visit: Payer: Self-pay | Admitting: Obstetrics & Gynecology

## 2020-08-26 ENCOUNTER — Telehealth: Payer: Self-pay

## 2020-08-26 NOTE — Telephone Encounter (Signed)
Pt states she will schedule her mammogram  

## 2020-08-26 NOTE — Telephone Encounter (Signed)
-----   Message from Gae Dry, MD sent at 08/26/2020  1:23 PM EST ----- Regarding: MMG Received notice she has not received MMG yet as ordered at her Annual. Please check and encourage her to do this, and document conversation.

## 2020-10-28 ENCOUNTER — Ambulatory Visit: Payer: PRIVATE HEALTH INSURANCE | Admitting: Dermatology

## 2020-11-12 ENCOUNTER — Ambulatory Visit: Payer: PRIVATE HEALTH INSURANCE | Admitting: Dermatology

## 2020-11-27 ENCOUNTER — Telehealth: Payer: PRIVATE HEALTH INSURANCE

## 2020-11-27 NOTE — Telephone Encounter (Signed)
-----   Message from Gae Dry, MD sent at 11/26/2020 10:21 AM EDT ----- Regarding: MMG Pt still has not had MMG done.  Please call and encourage her to do so, and even offer to help schedule the MMG if she feels this would help her.  The number is [phone] [facility].  Do not let her hang up without a plan to have this done.  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  Thanks for the help.

## 2020-11-27 NOTE — Telephone Encounter (Signed)
Called pt left a voice mail do call [facility] back we needed to get her scheduled for her MAMMOGRAM.

## 2021-02-28 ENCOUNTER — Other Ambulatory Visit: Payer: Self-pay | Admitting: Obstetrics & Gynecology

## 2021-02-28 DIAGNOSIS — Z1231 Encounter for screening mammogram for malignant neoplasm of breast: Secondary | ICD-10-CM

## 2021-07-17 ENCOUNTER — Ambulatory Visit
Admission: EM | Admit: 2021-07-17 | Discharge: 2021-07-17 | Disposition: A | Discharge status: Home / Self Care | Payer: PRIVATE HEALTH INSURANCE | Attending: Family Medicine | Admitting: Family Medicine

## 2021-07-17 ENCOUNTER — Other Ambulatory Visit: Payer: Self-pay

## 2021-07-17 DIAGNOSIS — J0100 Acute maxillary sinusitis, unspecified: Secondary | ICD-10-CM

## 2021-07-17 MED ORDER — DOXYCYCLINE HYCLATE 100 MG PO CAPS: 100.0000 mg | ORAL_CAPSULE | Freq: Two times a day (BID) | ORAL | 0 refills | Status: DC

## 2021-07-17 MED ORDER — ONDANSETRON 4 MG PO TBDP: 4.0000 mg | ORAL_TABLET | Freq: Three times a day (TID) | ORAL | 0 refills | Status: DC | PRN

## 2021-07-17 NOTE — ED Triage Notes (Signed)
Pt here with C/O sinus/ cold for 1 week, woke up yesterday morning vomiting that has since resolved. Woke up this morning with right ear pain.

## 2021-07-17 NOTE — ED Provider Notes (Signed)
[facility]    CSN: [id number] Arrival date & time: 07/17/21  0849      History   Chief Complaint Chief Complaint  Patient presents with   Otalgia    HPI  66 year old female presents with above complaint.  Patient reports that she has been sick since Monday.  She has had sinus pain and pressure particular on the right side of her face.  She also reports ear pain as of this morning.  She had a recent bout of nausea, vomiting, and diarrhea and this has now resolved.  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  She believes that she got sick from daycare.  No fever.  No other associated symptoms.  No other complaints  Past Medical History:  Diagnosis Date   Arthritis    Cancer ([place])    skin ca   Diabetes mellitus without complication ([place])    Esophageal reflux    History of dysplastic nevus 01/26/2015   left mid buttocks/mild   History of dysplastic nevus 02/18/2015   right prox post thigh/mild, left thigh/mild, left medial pretibial/mild   History of dysplastic nevus 10/26/2016   right med distal thigh above knee/mild   History of melanoma in situ 09/02/2014   left lower abdomen   Hypertension    Iron deficiency    Iron deficiency     Patient Active Problem List   Diagnosis Date Noted   Lichen sclerosus 73/41/9379    Past Surgical History:  Procedure Laterality Date   ABDOMINAL HYSTERECTOMY     BIOPSY ENDOMETRIAL     BREAST BIOPSY Left    needle bx-neg   CESAREAN SECTION     CRYOTHERAPY     DILATION AND CURETTAGE OF UTERUS     KNEE SURGERY     NASAL SINUS SURGERY      OB History     Gravida  2   Para  1   Term  1   Preterm      AB  1   Living  1      SAB  1   IAB      Ectopic      Multiple      Live Births               Home Medications    Prior to Admission medications   Medication Sig Start Date End Date Taking? Authorizing Provider  busPIRone (BUSPAR) 5 MG tablet Take 1 tablet by mouth 2 (two) times daily.  05/19/21  Yes [provider]  cetirizine (ZYRTEC) 10 MG tablet Take 1 tablet by mouth daily. 05/19/21  Yes [provider]  doxycycline (VIBRAMYCIN) 100 MG capsule Take 1 capsule (100 mg total) by mouth 2 (two) times daily. 07/17/21  Yes Jhaniya Briski G, DO  lisinopril (ZESTRIL) 20 MG tablet Take by mouth. 05/19/21  Yes [provider]  metFORMIN (GLUCOPHAGE) 500 MG tablet Take by mouth. 05/19/21  Yes [provider]  omeprazole (PRILOSEC) 20 MG capsule Take by mouth. 05/19/21  Yes [provider]  ondansetron (ZOFRAN-ODT) 4 MG disintegrating tablet Take 1 tablet (4 mg total) by mouth every 8 (eight) hours as needed for nausea or vomiting. 07/17/21  Yes America Sandall G, DO  pravastatin (PRAVACHOL) 10 MG tablet Take 10 mg by mouth daily.   Yes [provider]  sertraline (ZOLOFT) 100 MG tablet Take by mouth. 05/19/21  Yes [provider]    Family History Family History  Problem Relation Age of Onset   COPD Mother    Osteoporosis Mother    Emphysema Father    Brain cancer Sister    Lung cancer Sister    Heart attack Sister    Osteoporosis Sister    Brain cancer Brother    Lung cancer Brother    Osteoporosis Brother    Osteoporosis Maternal Aunt    Breast cancer Neg Hx     Social History Social History   Tobacco Use   Smoking status: Never   Smokeless tobacco: Never  Substance Use Topics   Alcohol use: No   Drug use: No     Allergies   Codeine, Hydrocodone, Other, Bupropion, Eggs or egg-derived products, and Moxifloxacin   Review of Systems Review of Systems Per HPI  Physical Exam Triage Vital Signs ED Triage Vitals  Enc Vitals Group     BP 07/17/21 0900 105/75     Pulse Rate 07/17/21 0900 92     Resp 07/17/21 0900 18     Temp 07/17/21 0900 98.9 F (37.2 C)     Temp Source 07/17/21 0900 Oral     SpO2 07/17/21 0900 97 %     Weight 07/17/21 0858 199 lb (90.3 kg)     Height 07/17/21 0858 5\' 3"  (1.6 m)      Head Circumference --      Peak Flow --      Pain Score 07/17/21 0858 8     Pain Loc --      Pain Edu? --      Excl. in [place]? --    No data found.  Updated Vital Signs BP 105/75 (BP Location: Left Arm)    Pulse 92    Temp 98.9 F (37.2 C) (Oral)    Resp 18    Ht 5\' 3"  (1.6 m)    Wt 90.3 kg    SpO2 97%    BMI 35.25 kg/m   Visual Acuity Right Eye Distance:   Left Eye Distance:   Bilateral Distance:    Right Eye Near:   Left Eye Near:    Bilateral Near:     Physical Exam Vitals and nursing note reviewed.  Constitutional:      General: She is not in acute distress.    Appearance: Normal appearance. She is not ill-appearing.  HENT:     Head: Normocephalic and atraumatic.     Right Ear: Tympanic membrane normal.     Left Ear: Tympanic membrane normal.     Nose:     Comments: Right maxillary sinus tenderness to palpation. Cardiovascular:     Rate and Rhythm: Normal rate and regular rhythm.  Pulmonary:     Effort: Pulmonary effort is normal.     Breath sounds: Normal breath sounds. No wheezing, rhonchi or rales.  Neurological:     Mental Status: She is alert.  Psychiatric:        Mood and Affect: Mood normal.        Behavior: Behavior normal.     UC Treatments / Results  Labs (all labs ordered are listed, but only abnormal results are displayed) Labs Reviewed - No data to display  EKG   Radiology No results found.  Procedures Procedures (including critical care time)  Medications Ordered in UC Medications - No data to display  Initial Impression / Assessment and Plan / UC Course  I have reviewed the triage vital signs and the nursing notes.  Pertinent labs & imaging results  that were available during my care of the patient were reviewed by me and considered in my medical decision making (see chart for details).    66 year old female presents with sinusitis.  Treating with doxycycline.  Zofran to be used as needed if she has any further issues with nausea  and/or vomiting.  Final Clinical Impressions(s) / UC Diagnoses   Final diagnoses:  Acute maxillary sinusitis, recurrence not specified     Discharge Instructions      Medication as prescribed.  Follow up with your PCP.  Take care  Dr. Lacinda Axon    ED Prescriptions     Medication Sig Dispense Auth. Provider   doxycycline (VIBRAMYCIN) 100 MG capsule Take 1 capsule (100 mg total) by mouth 2 (two) times daily. 14 capsule Reathel Turi G, DO   ondansetron (ZOFRAN-ODT) 4 MG disintegrating tablet Take 1 tablet (4 mg total) by mouth every 8 (eight) hours as needed for nausea or vomiting. 20 tablet Coral Spikes, DO      PDMP not reviewed this encounter.   Coral Spikes, DO 07/17/21 1011

## 2021-07-17 NOTE — Discharge Instructions (Signed)
Medication as prescribed.  Follow up with your PCP.  Take care  Dr. Kailene Steinhart  

## 2021-08-12 ENCOUNTER — Ambulatory Visit: Payer: PRIVATE HEALTH INSURANCE | Admitting: Dermatology

## 2021-09-10 ENCOUNTER — Other Ambulatory Visit: Payer: Self-pay

## 2021-09-10 ENCOUNTER — Ambulatory Visit
Admission: EM | Admit: 2021-09-10 | Discharge: 2021-09-10 | Disposition: A | Discharge status: Home / Self Care | Payer: PRIVATE HEALTH INSURANCE | Attending: Internal Medicine | Admitting: Internal Medicine

## 2021-09-10 ENCOUNTER — Encounter: Payer: Self-pay | Admitting: Emergency Medicine

## 2021-09-10 DIAGNOSIS — J029 Acute pharyngitis, unspecified: Secondary | ICD-10-CM | POA: Diagnosis present

## 2021-09-10 LAB — GROUP A STREP BY PCR: Group A Strep by PCR: NOT DETECTED

## 2021-09-10 MED ORDER — FLUTICASONE PROPIONATE 50 MCG/ACT NA SUSP: 2.0000 | Freq: Every day | NASAL | 0 refills | Status: AC

## 2021-09-10 NOTE — Discharge Instructions (Signed)
Your strep test was negative ?Try saline rinses called Netie pot to clean the post nasal drainage and you may also use Flonase at night time to help with that.  ?

## 2021-09-10 NOTE — ED Triage Notes (Signed)
Patient c/o sore throat off and on for couple of weeks.  Patient states that her grandkids tested positive for strep throat yesterday.  ?

## 2022-08-02 ENCOUNTER — Other Ambulatory Visit: Payer: Self-pay | Admitting: Family Medicine

## 2022-08-02 DIAGNOSIS — Z1231 Encounter for screening mammogram for malignant neoplasm of breast: Secondary | ICD-10-CM

## 2022-08-08 ENCOUNTER — Ambulatory Visit
Admission: RE | Admit: 2022-08-08 | Discharge: 2022-08-08 | Disposition: A | Discharge status: Home / Self Care | Payer: Medicare Other | Source: Ambulatory Visit | Attending: Family Medicine | Admitting: Family Medicine

## 2022-08-08 DIAGNOSIS — Z1231 Encounter for screening mammogram for malignant neoplasm of breast: Secondary | ICD-10-CM | POA: Insufficient documentation

## 2022-09-14 ENCOUNTER — Encounter: Payer: Self-pay | Admitting: Dermatology

## 2022-09-14 ENCOUNTER — Ambulatory Visit (INDEPENDENT_AMBULATORY_CARE_PROVIDER_SITE_OTHER): Payer: Medicare Other | Admitting: Dermatology

## 2022-09-14 DIAGNOSIS — L578 Other skin changes due to chronic exposure to nonionizing radiation: Secondary | ICD-10-CM | POA: Diagnosis not present

## 2022-09-14 DIAGNOSIS — Z86018 Personal history of other benign neoplasm: Secondary | ICD-10-CM

## 2022-09-14 DIAGNOSIS — D2262 Melanocytic nevi of left upper limb, including shoulder: Secondary | ICD-10-CM

## 2022-09-14 DIAGNOSIS — L814 Other melanin hyperpigmentation: Secondary | ICD-10-CM

## 2022-09-14 DIAGNOSIS — Z1283 Encounter for screening for malignant neoplasm of skin: Secondary | ICD-10-CM | POA: Diagnosis not present

## 2022-09-14 DIAGNOSIS — D229 Melanocytic nevi, unspecified: Secondary | ICD-10-CM

## 2022-09-14 DIAGNOSIS — L821 Other seborrheic keratosis: Secondary | ICD-10-CM

## 2022-09-14 DIAGNOSIS — R202 Paresthesia of skin: Secondary | ICD-10-CM

## 2022-09-14 DIAGNOSIS — L650 Telogen effluvium: Secondary | ICD-10-CM

## 2022-09-14 DIAGNOSIS — Z86006 Personal history of melanoma in-situ: Secondary | ICD-10-CM

## 2022-09-14 NOTE — Patient Instructions (Addendum)
Recommend OTC Gold Bond Rapid Relief Anti-Itch cream (pramoxine + menthol), CeraVe Anti-itch cream or lotion (pramoxine), Sarna lotion (Original- menthol + camphor or Sensitive- pramoxine) or Eucerin 12 hour Itch Relief lotion (menthol) up to 3 times per day to areas on body that are itchy.  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 Chronic rubbing or scratching causes darkening of the skin.  - OTC treatments which can help with itch include numbing creams like pramoxine or lidocaine which temporarily reduce itch or Capsaicin-containing creams which cause a burning sensation but which sometimes over time will reset the nerves to stop producing itch.  - If you choose to use Capsaicin cream, it is recommended to use it 5 times daily for 1 week followed by 3 times daily for 3-6 weeks. You may have to continue using it long-term.  - If not doing well with OTC options, could consider Skin Medicinals compounded prescription anti-itch cream with Amitriptyline 5% / Lidocaine 5% / Pramoxine 1% or Amitriptyline 5% / Gabapentin 10% / Lidocaine 5% Cream - For severe cases, there are some prescription cream or pill options which may help.  Recommend taking Heliocare sun protection supplement daily in sunny weather for additional sun protection. For maximum protection on the sunniest days, you can take up to 2 capsules of regular Heliocare OR take 1 capsule of Heliocare Ultra. For prolonged exposure (such as a full day in the sun), you can repeat your dose of the supplement 4 hours after your first dose. Heliocare can be purchased at [employer], at some [facility] or at [email or web address].    Melanoma ABCDEs  Melanoma is the most dangerous type of skin cancer, and is the leading cause of death from skin disease.  You are more likely to develop melanoma if  you: Have light-colored skin, light-colored eyes, or red or blond hair Spend a lot of time in the sun Tan regularly, either outdoors or in a tanning bed Have had blistering sunburns, especially during childhood Have a close family member who has had a melanoma Have atypical moles or large birthmarks  Early detection of melanoma is key since treatment is typically straightforward and cure rates are extremely high if we catch it early.   The first sign of melanoma is often a change in a mole or a new dark spot.  The ABCDE system is a way of remembering the signs of melanoma.  A for asymmetry:  The two halves do not match. B for border:  The edges of the growth are irregular. C for color:  A mixture of colors are present instead of an even brown color. D for diameter:  Melanomas are usually (but not always) greater than 63mm - the size of a pencil eraser. E for evolution:  The spot keeps changing in size, shape, and color.  Please check your skin once per month between visits. You can use a small mirror in front and a large mirror behind you to keep an eye on the back side or your body.   If you see any new or changing lesions before your next follow-up, please call to schedule a visit.  Please continue daily skin protection including broad spectrum sunscreen SPF 30+ to sun-exposed areas, reapplying every 2 hours as needed when you're outdoors.    Due to recent changes in healthcare laws, you may see results of your pathology  and/or laboratory studies on MyChart before the doctors have had a chance to review them. We understand that in some cases there may be results that are confusing or concerning to you. Please understand that not all results are received at the same time and often the doctors may need to interpret multiple results in order to provide you with the best plan of care or course of treatment. Therefore, we ask that you please give [place] 2 business days to thoroughly review all your  results before contacting the office for clarification. Should we see a critical lab result, you will be contacted sooner.   If You Need Anything After Your Visit  If you have any questions or concerns for your doctor, please call our main line at [phone] and press option 4 to reach your doctor's medical assistant. If no one answers, please leave a voicemail as directed and we will return your call as soon as possible. Messages left after 4 pm will be answered the following business day.   You may also send [place] a message via [place]. We typically respond to MyChart messages within 1-2 business days.  For prescription refills, please ask your pharmacy to contact our office. Our fax number is [phone].  If you have an urgent issue when the clinic is closed that cannot wait until the next business day, you can page your doctor at the number below.    Please note that while we do our best to be available for urgent issues outside of office hours, we are not available 24/7.   If you have an urgent issue and are unable to reach [place], you may choose to seek medical care at your doctor's office, retail clinic, urgent care center, or emergency room.  If you have a medical emergency, please immediately call 911 or go to the emergency department.  Pager Numbers  - Dr. Nehemiah Massed: [phone]  - Dr. Laurence Ferrari: [phone]  - Dr. Nicole Kindred: [phone]  In the event of inclement weather, please call our main line at [phone] for an update on the status of any delays or closures.  Dermatology Medication Tips: Please keep the boxes that topical medications come in in order to help keep track of the instructions about where and how to use these. Pharmacies typically print the medication instructions only on the boxes and not directly on the medication tubes.   If your medication is too expensive, please contact our office at [phone] option 4 or send [place] a message through [address].   We are  unable to tell what your co-pay for medications will be in advance as this is different depending on your insurance coverage. However, we may be able to find a substitute medication at lower cost or fill out paperwork to get insurance to cover a needed medication.   If a prior authorization is required to get your medication covered by your insurance company, please allow [place] 1-2 business days to complete this process.  Drug prices often vary depending on where the prescription is filled and some pharmacies may offer cheaper prices.  The website www.goodrx.com contains coupons for medications through different pharmacies. The prices here do not account for what the cost may be with help from insurance (it may be cheaper with your insurance), but the website can give you the price if you did not use any insurance.  - You can print the associated coupon and take it with your prescription to the pharmacy.  - You may also stop by our  office during regular business hours and pick up a GoodRx coupon card.  - If you need your prescription sent electronically to a different pharmacy, notify our office through [facility] or by phone at [phone] option 4.     Si Usted Necesita Algo Despus de Su Visita  Tambin puede enviarnos un mensaje a travs de Pharmacist, community. Por lo general respondemos a los mensajes de MyChart en el transcurso de 1 a 2 das hbiles.  Para renovar recetas, por favor pida a su farmacia que se ponga en contacto con nuestra oficina. Harland Dingwall de fax es [place] [phone].  Si tiene un asunto urgente cuando la clnica est cerrada y que no puede esperar hasta el siguiente da hbil, puede llamar/localizar a su doctor(a) al nmero que aparece a continuacin.   Por favor, tenga en cuenta que aunque hacemos todo lo posible para estar disponibles para asuntos urgentes fuera del horario de [place], no estamos disponibles las 24 horas del da, los 7 das de la [place].   Si tiene un  problema urgente y no puede comunicarse con nosotros, puede optar por buscar atencin mdica  en el consultorio de su doctor(a), en una clnica privada, en un centro de atencin urgente o en una sala de emergencias.  Si tiene Engineering geologist, por favor llame inmediatamente al 911 o vaya a la sala de emergencias.  Nmeros de bper  - Dr. Nehemiah Massed: [phone]  - Dra. Moye: [phone]  - Dra. Nicole Kindred: [phone]  En caso de inclemencias del [place], por favor llame a Johnsie Kindred principal al [phone] para una actualizacin sobre el [place] de cualquier retraso o cierre.  Consejos para la medicacin en dermatologa: Por favor, guarde las cajas en las que vienen los medicamentos de uso tpico para ayudarle a seguir las instrucciones sobre dnde y cmo usarlos. Las farmacias generalmente imprimen las instrucciones del medicamento slo en las cajas y no directamente en los tubos del [place].   Si su medicamento es muy caro, por favor, pngase en contacto con Zigmund Daniel llamando al [phone] y presione la opcin 4 o envenos un mensaje a travs de Pharmacist, community.   No podemos decirle cul ser su copago por los medicamentos por adelantado ya que esto es diferente dependiendo de la cobertura de su seguro. Sin embargo, es posible que podamos encontrar un medicamento sustituto a Electrical engineer un formulario para que el seguro cubra el medicamento que se considera necesario.   Si se requiere una autorizacin previa para que su compaa de seguros [place] su medicamento, por favor permtanos de 1 a 2 das hbiles para completar este proceso.  Los precios de los medicamentos varan con frecuencia dependiendo del Environmental consultant de dnde se surte la receta y alguna farmacias pueden ofrecer precios ms baratos.  El sitio web www.goodrx.com tiene cupones para medicamentos de Airline pilot. Los precios aqu no tienen en cuenta lo que podra costar con la ayuda del seguro (puede ser ms  barato con su seguro), pero el sitio web puede darle el precio si no utiliz Research scientist (physical sciences).  - Puede imprimir el cupn correspondiente y llevarlo con su receta a la farmacia.  - Tambin puede pasar por nuestra oficina durante el horario de atencin regular y Charity fundraiser una tarjeta de cupones de GoodRx.  - Si necesita que su receta se enve electrnicamente a una farmacia diferente, informe a nuestra oficina a travs de MyChart de [place] o por telfono llamando al [phone] y presione la opcin 4.

## 2022-09-14 NOTE — Progress Notes (Signed)
Follow-Up Visit   Subjective  Lindsay Wallace is a 67 y.o. female who presents for the following: Skin Cancer Screening and Full Body Skin Exam  The patient presents for Total-Body Skin Exam (TBSE) for skin cancer screening and mole check. The patient has spots, moles and lesions to be evaluated, some may be new or changing and the patient has concerns that these could be cancer.  Hx dysplastic nevi, MMis. Patient does have a spot at right eyebrow.   The following portions of the chart were reviewed this encounter and updated as appropriate: medications, allergies, medical history  Review of Systems:  No other skin or systemic complaints except as noted in HPI or Assessment and Plan.  Objective  Well appearing patient in no apparent distress; mood and affect are within normal limits.  A full examination was performed including scalp, head, eyes, ears, nose, lips, neck, chest, axillae, abdomen, back, buttocks, bilateral upper extremities, bilateral lower extremities, hands, feet, fingers, toes, fingernails, and toenails. All findings within normal limits unless otherwise noted below.   Relevant physical exam findings are noted in the Assessment and Plan.   Assessment & Plan   LENTIGINES, SEBORRHEIC KERATOSES, HEMANGIOMAS - Benign normal skin lesions - Benign-appearing - Call for any changes  MELANOCYTIC NEVI - Tan-brown and/or pink-flesh-colored symmetric macules and papules - Benign appearing on exam today - Observation - Call clinic for new or changing moles - Recommend daily use of broad spectrum spf 30+ sunscreen to sun-exposed areas.   ACTINIC DAMAGE - Chronic condition, secondary to cumulative UV/sun exposure - diffuse scaly erythematous macules with underlying dyspigmentation - Recommend daily broad spectrum sunscreen SPF 30+ to sun-exposed areas, reapply every 2 hours as needed.  - Staying in the shade or wearing long sleeves, sun glasses (UVA+UVB protection) and wide  brim hats (4-inch brim around the entire circumference of the hat) are also recommended for sun protection.  - Call for new or changing lesions.  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 Chronic rubbing or scratching causes darkening of the skin.  OTC treatments which can help with itch include numbing creams like pramoxine or lidocaine which temporarily reduce itch or Capsaicin-containing creams which cause a burning sensation but which sometimes over time will reset the nerves to stop producing itch.  If you choose to use Capsaicin cream, it is recommended to use it 5 times daily for 1 week followed by 3 times daily for 3-6 weeks. You may have to continue using it long-term.  If not doing well with OTC options, could consider Skin Medicinals compounded prescription anti-itch cream with Amitriptyline 5% / Lidocaine 5% / Pramoxine 1% or Amitriptyline 5% / Gabapentin 10% / Lidocaine 5% Cream or other prescription cream or pill options.   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  Telogen effluvium is a benign, self-limited condition causing increased hair shedding usually for several months. It does not progress to baldness, and the hair eventually grows back on its own. It can be triggered by recent illness, recent surgery, thyroid disease, low iron stores, vitamin D deficiency, fad diets or rapid weight loss, hormonal changes such as pregnancy or birth control pills, and some medication. Usually the hair loss starts 2-3 months after the illness or health change. Rarely, it can continue for longer than a year. Treatments options may include oral or topical Minoxidil; Red  Light scalp treatments; Biotin 2.5 mg daily and other options.  Treatment Plan: Recommend OTC minoxidil. Patient has been on Ozempic and advises hair loss  has started to slow down.   SKIN CANCER SCREENING PERFORMED TODAY.  History of Dysplastic Nevi - No evidence of recurrence today - Recommend regular full body skin exams - Recommend daily broad spectrum sunscreen SPF 30+ to sun-exposed areas, reapply every 2 hours as needed.  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  - Call if any new or changing lesions are noted between office visits  MELANOCYTIC NEVI Benign appearing on exam today. Recommend observation. Call clinic for new or changing moles. Recommend daily use of broad spectrum spf 30+ sunscreen to sun-exposed areas.  Exam 0.7 cm brown macule at left palm with parallel furrow pattern       Return for 2-3 month nevus follow up, 1 year FBSE , Hx MMis, Hx Dysplastic Nevi.  Graciella Belton, RMA, am acting as scribe for Forest Gleason, MD .   Documentation: I have reviewed the above documentation for accuracy and completeness, and I agree with the above.  Forest Gleason, MD

## 2022-10-06 DIAGNOSIS — M25551 Pain in right hip: Secondary | ICD-10-CM

## 2022-10-06 HISTORY — DX: Pain in right hip: M25.551

## 2022-10-07 DIAGNOSIS — M179 Osteoarthritis of knee, unspecified: Secondary | ICD-10-CM

## 2022-10-07 HISTORY — DX: Osteoarthritis of knee, unspecified: M17.9

## 2022-12-01 ENCOUNTER — Ambulatory Visit: Payer: Medicare Other | Admitting: Dermatology

## 2023-04-18 ENCOUNTER — Encounter: Payer: Self-pay | Admitting: Dermatology

## 2023-04-18 ENCOUNTER — Ambulatory Visit: Payer: Medicare Other | Admitting: Dermatology

## 2023-04-18 DIAGNOSIS — R21 Rash and other nonspecific skin eruption: Secondary | ICD-10-CM

## 2023-04-18 DIAGNOSIS — L920 Granuloma annulare: Secondary | ICD-10-CM

## 2023-04-18 DIAGNOSIS — Z7189 Other specified counseling: Secondary | ICD-10-CM | POA: Diagnosis not present

## 2023-04-18 MED ORDER — TRIAMCINOLONE ACETONIDE 0.1 % EX CREA: TOPICAL_CREAM | CUTANEOUS | 2 refills | Status: AC

## 2023-04-18 NOTE — Progress Notes (Signed)
   Follow-Up Visit   Subjective  Lindsay Wallace is a 67 y.o. female who presents for the following: Spot on left hand. Dur: 3 months. Has been larger/more raised. Is now spreading. Non tender. Itches at times. Has not put anything on the area. Similar rash has come and gone for years  Patient states Dr. Neale Burly has injected lesions on her right elbow in the past. States they would come and go. Raised, irritated.   The patient has spots, moles and lesions to be evaluated, some may be new or changing and the patient may have concern these could be cancer.    The following portions of the chart were reviewed this encounter and updated as appropriate: medications, allergies, medical history  Review of Systems:  No other skin or systemic complaints except as noted in HPI or Assessment and Plan.  Objective  Well appearing patient in no apparent distress; mood and affect are within normal limits.  A focused examination was performed of the following areas: Left hand  Relevant physical exam findings are noted in the Assessment and Plan.           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 Its cause is not known, and most episodes of granuloma annulare clear up after a few years, with or without treatment.  Offered biopsy for confirmation. Patient opts for observation and topical treatment Start Triamcinolone 0.1% cream twice daily to affected areas as needed for itching. Avoid applying to face, groin, and axilla. Use as directed. Long-term use can cause thinning of the skin.  Topical steroids (such as triamcinolone,  fluocinolone, fluocinonide, mometasone, clobetasol, halobetasol, betamethasone, hydrocortisone) can cause thinning and lightening of the skin if they are used for too long in the same area. Your physician has selected the right strength medicine for your problem and area affected on the body. Please use your medication only as directed by your physician to prevent side effects.    Discussed biopsy to be more certain of diagnosis. Patient prefers to treat topically and recheck in 6 months. Will have Dr. Gwen Pounds recheck areas.   Return for TBSE As Scheduled, With Dr. Gwen Pounds.  I, Lawson Radar, CMA, am acting as scribe for Elie Goody, MD.   Documentation: I have reviewed the above documentation for accuracy and completeness, and I agree with the above.  Elie Goody, MD

## 2023-04-18 NOTE — Patient Instructions (Addendum)
Start Triamcinolone 0.1% cream twice daily to affected areas as needed for itching. Avoid applying to face, groin, and axilla. Use as directed. Long-term use can cause thinning of the skin.  Topical steroids (such as triamcinolone, fluocinolone, fluocinonide, mometasone, clobetasol, halobetasol, betamethasone, hydrocortisone) can cause thinning and lightening of the skin if they are used for too long in the same area. Your physician has selected the right strength medicine for your problem and area affected on the body. Please use your medication only as directed by your physician to prevent side effects.      Due to recent changes in healthcare laws, you may see results of your pathology and/or laboratory studies on MyChart before the doctors have had a chance to review them. We understand that in some cases there may be results that are confusing or concerning to you. Please understand that not all results are received at the same time and often the doctors may need to interpret multiple results in order to provide you with the best plan of care or course of treatment. Therefore, we ask that you please give [place] 2 business days to thoroughly review all your results before contacting the office for clarification. Should we see a critical lab result, you will be contacted sooner.   If You Need Anything After Your Visit  If you have any questions or concerns for your doctor, please call our main line at [phone] and press option 4 to reach your doctor's medical assistant. If no one answers, please leave a voicemail as directed and we will return your call as soon as possible. Messages left after 4 pm will be answered the following business day.   You may also send [place] a message via MyChart. We typically respond to MyChart messages within 1-2 business days.  For prescription refills, please ask your pharmacy to contact our office. Our fax number is [phone].  If you have an urgent issue when the  clinic is closed that cannot wait until the next business day, you can page your doctor at the number below.    Please note that while we do our best to be available for urgent issues outside of office hours, we are not available 24/7.   If you have an urgent issue and are unable to reach [place], you may choose to seek medical care at your doctor's office, retail clinic, urgent care center, or emergency room.  If you have a medical emergency, please immediately call 911 or go to the emergency department.  Pager Numbers  - Dr. Gwen Pounds: [phone]  - Dr. Roseanne Reno: [phone]  - Dr. Katrinka Blazing: [phone]   In the event of inclement weather, please call our main line at [phone] for an update on the status of any delays or closures.  Dermatology Medication Tips: Please keep the boxes that topical medications come in in order to help keep track of the instructions about where and how to use these. Pharmacies typically print the medication instructions only on the boxes and not directly on the medication tubes.   If your medication is too expensive, please contact our office at [phone] option 4 or send [place] a message through MyChart.   We are unable to tell what your co-pay for medications will be in advance as this is different depending on your insurance coverage. However, we may be able to find a substitute medication at lower cost or fill out paperwork to get insurance to cover a needed medication.   If a prior authorization is  required to get your medication covered by your insurance company, please allow [place] 1-2 business days to complete this process.  Drug prices often vary depending on where the prescription is filled and some pharmacies may offer cheaper prices.  The website www.goodrx.com contains coupons for medications through different pharmacies. The prices here do not account for what the cost may be with help from insurance (it may be cheaper with your insurance), but the  website can give you the price if you did not use any insurance.  - You can print the associated coupon and take it with your prescription to the pharmacy.  - You may also stop by our office during regular business hours and pick up a GoodRx coupon card.  - If you need your prescription sent electronically to a different pharmacy, notify our office through [facility] or by phone at [phone] option 4.     Si Usted Necesita Algo Despus de Su Visita  Tambin puede enviarnos un mensaje a travs de Clinical cytogeneticist. Por lo general respondemos a los mensajes de MyChart en el transcurso de 1 a 2 das hbiles.  Para renovar recetas, por favor pida a su farmacia que se ponga en contacto con nuestra oficina. Annie Sable de fax es [place] [phone].  Si tiene un asunto urgente cuando la clnica est cerrada y que no puede esperar hasta el siguiente da hbil, puede llamar/localizar a su doctor(a) al nmero que aparece a continuacin.   Por favor, tenga en cuenta que aunque hacemos todo lo posible para estar disponibles para asuntos urgentes fuera del horario de [place], no estamos disponibles las 24 horas del da, los 7 [address]  [address] de la [place].   Si tiene un problema urgente y no puede comunicarse con nosotros, puede optar por buscar atencin mdica  en el consultorio de su doctor(a), en una clnica privada, en un centro de atencin urgente o en una sala de emergencias.  Si tiene Engineer, drilling, por favor llame inmediatamente al 911 o vaya a la sala de emergencias.  Nmeros de bper  - Dr. Gwen Pounds: [phone]  - Dra. Roseanne Reno: [phone]  - Dr. Katrinka Blazing: [phone]   En caso de inclemencias del tiempo, por favor llame a Lacy Duverney principal al [phone] para una actualizacin sobre el [place] de cualquier retraso o cierre.  Consejos para la medicacin en dermatologa: Por favor, guarde las cajas en las que vienen los medicamentos de uso tpico para ayudarle a seguir las  instrucciones sobre dnde y cmo usarlos. Las farmacias generalmente imprimen las instrucciones del medicamento slo en las cajas y no directamente en los tubos del [place].   Si su medicamento es muy caro, por favor, pngase en contacto con Rolm Gala llamando al [phone] y presione la opcin 4 o envenos un mensaje a travs de Clinical cytogeneticist.   No podemos decirle cul ser su copago por los medicamentos por adelantado ya que esto es diferente dependiendo de la cobertura de su seguro. Sin embargo, es posible que podamos encontrar un medicamento sustituto a Audiological scientist un formulario para que el seguro cubra el medicamento que se considera necesario.   Si se requiere una autorizacin previa para que su compaa de seguros [place] su medicamento, por favor permtanos de 1 a 2 das hbiles para completar [address]  Los precios de los medicamentos varan con frecuencia dependiendo del Environmental consultant de dnde se surte la receta y alguna farmacias pueden ofrecer precios ms baratos.  El sitio web www.goodrx.com tiene cupones para medicamentos  de [employer]. Los precios aqu no tienen en cuenta lo que podra costar con la ayuda del seguro (puede ser ms barato con su seguro), pero el sitio web puede darle el precio si no utiliz Tourist information centre manager.  - Puede imprimir el cupn correspondiente y llevarlo con su receta a la farmacia.  - Tambin puede pasar por nuestra oficina durante el horario de atencin regular y Education officer, museum una tarjeta de cupones de GoodRx.  - Si necesita que su receta se enve electrnicamente a una farmacia diferente, informe a nuestra oficina a travs de MyChart de [place] o por telfono llamando al [phone] y presione la opcin 4.

## 2023-05-29 ENCOUNTER — Other Ambulatory Visit: Payer: Self-pay | Admitting: Family Medicine

## 2023-05-29 DIAGNOSIS — Z1231 Encounter for screening mammogram for malignant neoplasm of breast: Secondary | ICD-10-CM

## 2023-09-20 ENCOUNTER — Encounter: Payer: Medicare Other | Admitting: Dermatology

## 2023-11-14 ENCOUNTER — Ambulatory Visit
Admission: RE | Admit: 2023-11-14 | Discharge: 2023-11-14 | Disposition: A | Discharge status: Home / Self Care | Source: Ambulatory Visit | Attending: Family Medicine | Admitting: Family Medicine

## 2023-11-14 DIAGNOSIS — Z1231 Encounter for screening mammogram for malignant neoplasm of breast: Secondary | ICD-10-CM | POA: Diagnosis present

## 2023-11-20 ENCOUNTER — Other Ambulatory Visit: Payer: Self-pay | Admitting: Family Medicine

## 2023-11-20 DIAGNOSIS — R928 Other abnormal and inconclusive findings on diagnostic imaging of breast: Secondary | ICD-10-CM

## 2023-11-23 ENCOUNTER — Other Ambulatory Visit

## 2023-11-23 ENCOUNTER — Encounter

## 2023-12-05 ENCOUNTER — Encounter: Payer: Self-pay | Admitting: Dermatology

## 2023-12-05 ENCOUNTER — Ambulatory Visit: Admitting: Dermatology

## 2023-12-05 DIAGNOSIS — D229 Melanocytic nevi, unspecified: Secondary | ICD-10-CM

## 2023-12-05 DIAGNOSIS — W908XXA Exposure to other nonionizing radiation, initial encounter: Secondary | ICD-10-CM

## 2023-12-05 DIAGNOSIS — Z86006 Personal history of melanoma in-situ: Secondary | ICD-10-CM

## 2023-12-05 DIAGNOSIS — L820 Inflamed seborrheic keratosis: Secondary | ICD-10-CM

## 2023-12-05 DIAGNOSIS — R238 Other skin changes: Secondary | ICD-10-CM

## 2023-12-05 DIAGNOSIS — Z85820 Personal history of malignant melanoma of skin: Secondary | ICD-10-CM

## 2023-12-05 DIAGNOSIS — L578 Other skin changes due to chronic exposure to nonionizing radiation: Secondary | ICD-10-CM

## 2023-12-05 DIAGNOSIS — Z872 Personal history of diseases of the skin and subcutaneous tissue: Secondary | ICD-10-CM

## 2023-12-05 DIAGNOSIS — Z1283 Encounter for screening for malignant neoplasm of skin: Secondary | ICD-10-CM

## 2023-12-05 DIAGNOSIS — L821 Other seborrheic keratosis: Secondary | ICD-10-CM

## 2023-12-05 DIAGNOSIS — D2262 Melanocytic nevi of left upper limb, including shoulder: Secondary | ICD-10-CM

## 2023-12-05 DIAGNOSIS — I878 Other specified disorders of veins: Secondary | ICD-10-CM

## 2023-12-05 DIAGNOSIS — L814 Other melanin hyperpigmentation: Secondary | ICD-10-CM

## 2023-12-05 DIAGNOSIS — Z86018 Personal history of other benign neoplasm: Secondary | ICD-10-CM

## 2023-12-05 NOTE — Progress Notes (Signed)
 Follow-Up Visit   Subjective  Lindsay Wallace is a 68 y.o. female who presents for the following: Skin Cancer Screening and Full Body Skin Exam hx of Melanoma IS, Dysplastic Nevi, AK, check mole L palm, check new mole R eyebrow, growths legs irritating  The patient presents for Total-Body Skin Exam (TBSE) for skin cancer screening and mole check. The patient has spots, moles and lesions to be evaluated, some may be new or changing and the patient may have concern these could be cancer.  The following portions of the chart were reviewed this encounter and updated as appropriate: medications, allergies, medical history  Review of Systems:  No other skin or systemic complaints except as noted in HPI or Assessment and Plan.  Objective  Well appearing patient in no apparent distress; mood and affect are within normal limits.  A full examination was performed including scalp, head, eyes, ears, nose, lips, neck, chest, axillae, abdomen, back, buttocks, bilateral upper extremities, bilateral lower extremities, hands, feet, fingers, toes, fingernails, and toenails. All findings within normal limits unless otherwise noted below.   Relevant physical exam findings are noted in the Assessment and Plan.  L lat thigh x 1, L popliteal fossa x 1, R lat brow x 1 (3) Stuck on waxy paps with erythema  L index finger   L palm   Assessment & Plan   SKIN CANCER SCREENING PERFORMED TODAY.  ACTINIC DAMAGE - Chronic condition, secondary to cumulative UV/sun exposure - diffuse scaly erythematous macules with underlying dyspigmentation - Recommend daily broad spectrum sunscreen SPF 30+ to sun-exposed areas, reapply every 2 hours as needed.  - Staying in the shade or wearing long sleeves, sun glasses (UVA+UVB protection) and wide brim hats (4-inch brim around the entire circumference of the hat) are also recommended for sun protection.  - Call for new or changing lesions.  LENTIGINES, SEBORRHEIC  KERATOSES, HEMANGIOMAS - Benign normal skin lesions - Benign-appearing - Call for any changes  MELANOCYTIC NEVI - Tan-brown and/or pink-flesh-colored symmetric macules and papules - Benign appearing on exam today - Observation - Call clinic for new or changing moles - Recommend daily use of broad spectrum spf 30+ sunscreen to sun-exposed areas.  - L index finger brown macules x 2, see photo  HISTORY OF MELANOMA IN SITU - No evidence of recurrence today - Recommend regular full body skin exams - Recommend daily broad spectrum sunscreen SPF 30+ to sun-exposed areas, reapply every 2 hours as needed.  - Call if any new or changing lesions are noted between office visits  - L lower abdomen, 08/2014  HISTORY OF DYSPLASTIC NEVUS No evidence of recurrence today Recommend regular full body skin exams Recommend daily broad spectrum sunscreen SPF 30+ to sun-exposed areas, reapply every 2 hours as needed.  Call if any new or changing lesions are noted between office visits  - L mid buttocks, R prox post thigh, L thigh, L medial pretibial, R medial distal thigh above knee  HISTORY OF PRECANCEROUS ACTINIC KERATOSIS - site(s) of PreCancerous Actinic Keratosis clear today. - these may recur and new lesions may form requiring treatment to prevent transformation into skin cancer - observe for new or changing spots and contact [facility] for appointment if occur - photoprotection with sun protective clothing; sunglasses and broad spectrum sunscreen with SPF of at least 30 + and frequent self skin exams recommended - yearly exams by a dermatologist recommended for persons with history of PreCancerous Actinic Keratoses   [place] L  deltoid Exam: red or purple papule L deltoid Treatment Plan: Benign-appearing.  Observation.  Call clinic for new or changing lesions.  Recommend daily use of broad spectrum spf 30+ sunscreen to sun-exposed areas.   TRAUMA vs NEVUS L palm Exam: - L palm  0.7 cm brown macule with parallel furrow pattern, see photo Treatment Plan: Benign appearing, observe Lighter appearing today when compared to photos from 09/14/2022  INFLAMED SEBORRHEIC KERATOSIS (3) L lat thigh x 1, L popliteal fossa x 1, R lat brow x 1 (3) Symptomatic, irritating, patient would like treated. Destruction of lesion - L lat thigh x 1, L popliteal fossa x 1, R lat brow x 1 (3) Complexity: simple   Destruction method: cryotherapy   Informed consent: discussed and consent obtained   Timeout:  patient name, date of birth, surgical site, and procedure verified Lesion destroyed using liquid nitrogen: Yes   Region frozen until ice ball extended beyond lesion: Yes   Outcome: patient tolerated procedure well with no complications   Post-procedure details: wound care instructions given    Return in about 1 year (around 12/04/2024) for TBSE, Hx of Melanoma IS, Hx of Dysplastic nevi, Hx of AKs.  I, Rollie Clipper, RMA, am acting as scribe for Celine Collard, MD .   Documentation: I have reviewed the above documentation for accuracy and completeness, and I agree with the above.  Celine Collard, MD

## 2023-12-05 NOTE — Patient Instructions (Addendum)

## 2023-12-08 ENCOUNTER — Ambulatory Visit
Admission: RE | Admit: 2023-12-08 | Discharge: 2023-12-08 | Disposition: A | Discharge status: Home / Self Care | Source: Ambulatory Visit | Attending: Family Medicine | Admitting: Family Medicine

## 2023-12-08 DIAGNOSIS — R928 Other abnormal and inconclusive findings on diagnostic imaging of breast: Secondary | ICD-10-CM | POA: Diagnosis present

## 2024-06-25 ENCOUNTER — Telehealth: Payer: Self-pay

## 2024-06-25 NOTE — Telephone Encounter (Signed)
 Patient call returned.  She has requested to schedule an office visit/procedures scheduled for the following: Having issues with hiatal hernia GERD Abdominal pain and bloating 4. Weight loss  This has also been noted in her yesterday's office visit with Ike Lavender.  Informed patient that we have not received her referral yet but once it has been received front office will contact her to schedule an office visit.  Message has been routed to front office team to make them aware that she is ready to schedule once referral has been received.  Thanks,  Rosaline CMA

## 2024-07-09 NOTE — Progress Notes (Signed)
 CHIEF COMPLAINT:   Chief Complaint  Patient presents with   Rash    Patient seen 2 days ago for contact dermatitis. Not getting better. R wrist area now swollen and red which is new.    SUBJECTIVE   HPI:  Lindsay Wallace is a 69 y.o. female who presents to the urgent care clinic today with chief complaint of gall bladder concern.  Patient has had bilateral hands and wrists itchiness for the past few days.  Also reports some intermittent itchiness of her right foot.  She was seen by [facility] on 07/07/24 and diagnosed with contact dermatitis due to possible iodine spray.  Was given zyrtec and triamcinolone  cream without relief.  Patient also using benadryl and cortisone cream. Of note, patient is also having epigastric pain and is concerned for possible cholestasis due to her symptoms. She states she was taking trazodone, metformin, Ozempic, and Crestor.  Also she reports using Nutrafol supplement which she saw has caused gall bladder problems for some individuals.  She has since stopped the trazodone and Nutrafol supplement. She has PCP follow up tomorrow and GI f/u on 07/23/24. H/o DMII - last A1c 5.8, GERD, HTN, asthma  Reports mild constipation. Denies fever, chills, nausea, vomiting, body aches, drooling, tongue swelling, difficulty swallowing/breathing, extremity weakness, or numbness/tingling of extremities.  REVIEW OF SYSTEMS:Pertinent negatives and positives are noted in HPI.  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 VITALS: I have reviewed vital signs to include temperature, blood pressure, heart rate, and pulse oximetry as documented by nursing staff.  HEAD: Normocephalic EYES: EOMI. Conjunctiva Clear.  No swelling around the eyes or of the eyelids. MOUTH: Oropharynx clear.  No swelling of the lips.  Tongue is normal size NOSE: Nasal septum midline LUNGS: Comfortably breathing; clear to auscultation. No wheezing, rales, or rhonchi. Chest rising symmetric bilaterally.  No drooling, tripoding, stridor HEART: Regular rate and rhythm.  ABDOMEN: normoactive bowel sounds.  Mild ttp RUQ and epigastric region.  No peritoneal signs. No flank pain. Negative Murphy's sign. SKIN: Warm, dry, good color.  No petechiae or purpura.  Patient noted to have erythematous patch along right distal wrist into palm.  No vesicular or honey crusted lesions.  No papular lesions present.  No pain with palpation. MENTAL Status: Speech clear. Alert and orientated x3. Responds appropriately to questions. Follows commands.   LABS/X-RAYS/EKG/MEDS:      No results found for this visit on 07/09/24.   ASSESSMENT/PLAN:     ICD-10-CM   1. Pruritic rash  L28.2 Comprehensive Metabolic Panel (CMP)    2. Right upper quadrant pain  R10.11 Comprehensive Metabolic Panel (CMP)    Lipase     Requested Prescriptions    No prescriptions requested or ordered in this encounter   CMP: pending Lipase: pending  This is a 69yo F patient who presents to the urgent care clinic for chief complaint of gall bladder concern. Patient is in no acute respiratory distress.  No findings suggestive of cellulitis, lymphangitis, or necrotizing fasciitis noted.  Patient with pruritic rash to bilateral wrists/palms with right upper quadrant abdominal pain.  No papular lesions for rash.  Rash likely exacerbated due to continued scratching.  Due to concern for gall bladder etiology  secondary from supplement use, CMP and Lipase pending.  Will hold oral steroids due to interaction if symptoms related to cholestasis.  Educated on symptomatic management including take Benadryl as needed for itching and continue with PCP follow up tomorrow for further management.  Advised to follow-up here in the urgent care clinic or at the emergency department for acute changes in symptoms as discussed and listed in discharge instructions. Patient expressed understanding of plan and asked insightful questions I have reviewed patient's previous medical record  A copy of these instructions have been given to the patient or responsible adult who demonstrated the ability to learn, asked appropriate questions, and verbalized understanding of the plan of care.  There were no barriers to learning identified. Coding of chart is based on MDM

## 2024-07-10 NOTE — Progress Notes (Signed)
 " Chief Complaint  Patient presents with   Follow-up    DM check A1C   Allergic Reaction    Started last Thursday - progressed . BUE / feet . Itching / redness . Went to UC yesterday . Current discomfort 5/10 itchy to skin     Subjective  Lindsay Wallace is a 69 y.o. female who presents for Follow-up (DM check A1C) and Allergic Reaction (Started last Thursday - progressed . BUE / feet . Itching / redness . Went to UC yesterday . Current discomfort 5/10 itchy to skin ) HPI History of Present Illness Lindsay Wallace is a 69 year old female who presents with itching and swelling of the hands and feet.  The itching began on Wednesday night on her palm and spread to the back of her hand by Thursday, specifically affecting one finger. By Friday, the itching intensified, accompanied by dryness and continued scratching. The itching is absent on her face or trunk. Hot showers make the itching worse.  She describes swelling and tightness in her hands, with a knot at the base of her left thumb last night. She also experienced knots on the ball and arch of her feet, which have since subsided. Her feet were swollen on Sunday but did not itch. She is concerned about the condition spreading.  She has a history of allergic reactions requiring hospital visits. She uses ice packs and pressure to manage symptoms, with Zyrtec for itching. She has not taken Benadryl. Triamcinolone  cream has been ineffective. She experienced the longest relief from itching when sleeping with an ice pack on her arm, allowing her to sleep for five hours.  Her current medications include Buspar, lisinopril, metformin, omeprazole, rosuvastatin, Ozempic, Zoloft, and trazodone. She has stopped taking supplements such as calcium, magnesium, zinc, fish oil, and multivitamins.  She is in couples counseling, attending sessions almost weekly since July. She works and needs to function during the day, influencing her symptom  management.          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       cetirizine (ZYRTEC) 10 MG tablet Take 1 tablet (10 mg total) by mouth once daily 30 tablet 0   Compound Medication Take 2 capsules by mouth once daily Med Name: [place] for brain memory (Patient taking differently: Take 2 capsules by mouth once daily Med Name: [facility])     docosahexaenoic acid/epa (FISH OIL ORAL) Take 1 capsule by mouth once daily     docusate sodium (STOOL SOFTENER ORAL) Take 1 tablet by mouth 3 (three) times daily     fluticasone  propionate (FLONASE ) 50 mcg/actuation nasal spray Place 2 sprays into both nostrils once daily     ipratropium (ATROVENT) 0.06 % nasal spray Place 2 sprays into both nostrils 3 (three) times daily 15 mL 0   metFORMIN (GLUCOPHAGE) 500 MG tablet Take 1 tablet (500 mg total) by mouth 2 (two) times daily  with meals 360 tablet 4   multivitamin capsule Take 1 capsule by mouth daily.       semaglutide (OZEMPIC) 2 mg/dose (8 mg/3 mL) pen injector Inject 0.75 mLs (2 mg total) subcutaneously once a week 9 mL 3   triamcinolone  0.5 % cream Apply topically 2 (two) times daily 30 g 0   busPIRone (BUSPAR) 5 MG tablet Take 1 tablet (5 mg total) by mouth 2 (two) times daily 200 tablet 4   lisinopriL (ZESTRIL) 20 MG tablet Take 1 tablet (20 mg total) by mouth once daily 100 tablet 4   omeprazole (PRILOSEC)  20 MG DR capsule Take 1 capsule (20 mg total) by mouth once daily 90 capsule 4   rosuvastatin (CRESTOR) 40 MG tablet Take 1 tablet (40 mg total) by mouth once daily 100 tablet 4   sertraline (ZOLOFT) 100 MG tablet Take 1 tablet (100 mg total) by mouth once daily 100 tablet 4   traZODone (DESYREL) 50 MG tablet TAKE 1 TABLET BY MOUTH AT BEDTIME 30 tablet 7   ipratropium (ATROVENT) 21 mcg (0.03 %) nasal spray Place 2 sprays into both nostrils 2 (two) times daily 30 mL 0   levocetirizine (XYZAL) 5 MG tablet Take 5 mg by mouth every evening (Patient not taking: Reported on 07/10/2024)     benzonatate (TESSALON) 200 MG capsule Take 1 capsule (200 mg total) by mouth 3 (three) times daily as needed for Cough (Patient not taking: Reported on 06/07/2024) 30 capsule 0   No facility-administered medications prior to visit.      Objective  Vitals:   07/10/24 0822  BP: 130/71  Pulse: 73  Weight: 72.3 kg (159 lb 6.3 oz)  PainSc:   5  PainLoc: Other (Comment)   Body mass index is 29.33 kg/m.  Home Vitals:     Physical Exam Physical Exam    Constitutional: alert, in NAD, and communicates well Skin ankles/feet: lightly erythematous skin to halfway up forearms without increased warmth.  +excoriation marks.  No pus.  She unconciously itches herself in room, though generally keeps hands on a cold pack.  No scaling, vesicles.  Results Labs Blood glucose (fingerstick) (07/09/2024): 5.8     Assessment/Plan:   Assessment & Plan Pruritus of hands and feet Pruritus primarily affecting hands and feet, with recent onset on Wednesday night. Symptoms include itching and swelling, particularly on the left thumb base and feet. No facial or trunk involvement. Likely exacerbated by dry skin and histamine release. Differential does not include allergic reaction as a likely cause of the itching due to its localized presentation. Current treatment with Zyrtec and triamcinolone  cream has been  insufficient. - Prescribed hydroxyzine 25 mg, one pill three times a day as needed for itching. - Prescribed clobetasol  ointment, apply three times a day for up to two weeks, then reduce to twice a day. - Advised against using Zyrtec with hydroxyzine due to potential excessive sedation. - Recommended using ice packs and pressure to alleviate symptoms. - Instructed to apply clobetasol  within three minutes of showering to lock in moisture. - Advised against hot showers as she may worsen symptoms.  Gastroesophageal reflux disease Awaiting GI specialist appointment on February 4th. - Continue current GERD management with omeprazole.  Essential hypertension Blood pressure is well-controlled with current medication regimen. - Continue current antihypertensive regimen with lisinopril.  Type 2 diabetes mellitus Blood sugar levels are well-controlled with recent HbA1c of 5.8%. - Continue current diabetes management with metformin and semaglutide.  Diagnoses and all  orders for this visit:  Essential hypertension -     lisinopril (ZESTRIL) 20 MG tablet; Take 1 tablet (20 mg total) by mouth once daily  Type 2 diabetes mellitus without complication, without long-term current use of insulin (CMS/HHS-HCC) -     [facility] POC Hemoglobin A1C; Future  Gastroesophageal reflux disease, unspecified whether esophagitis present -     omeprazole (PRILOSEC) 20 MG DR capsule; Take 1 capsule (20 mg total) by mouth once daily  Recurrent major depressive disorder, in partial remission () -     sertraline (ZOLOFT) 100 MG tablet; Take 1 tablet (100 mg total) by mouth once daily  Hypercholesterolemia -     rosuvastatin (CRESTOR) 40 MG tablet; Take 1 tablet (40 mg total) by mouth once daily  Anxiety state -     buspirone (BUSPAR) 5 MG tablet; Take 1 tablet (5 mg total) by mouth 2 (two) times daily  Itching -     hydrOXYzine (ATARAX) 25 MG tablet; Take 1 tablet (25 mg total) by mouth 3 (three) times daily as needed  for Itching for up to 10 days  Post-menopausal -     DXA bone density; Future  Other orders -     traZODone (DESYREL) 50 MG tablet; Take 1 tablet (50 mg total) by mouth at bedtime -     clobetasol  (TEMOVATE ) 0.05 % ointment; Apply topically 2 (two) times daily -     Follow up in Primary Care; Future     This visit was coded based on medical decision making (MDM).     Follow up in 1 year (on 07/10/2025). Follow-up     Future Labs/Procedures Expected by Expires   Follow up in Primary Care [[id number] Custom]  07/10/2025 09/07/2025   Questions:     Does this order need to be coordinated with another visit or should it be hidden from the patient portal? If yes to either, the patient will need to stop by the front desk or call to schedule.: No   Who is this follow-up with?: PCP   What type of follow up is needed?: Physical   What's the reason for follow up?:          Future Appointments     Date/Time Provider Department Center Visit Type   01/06/2025 9:00 AM (Arrive by 8:45 AM) Rojelio Ike Jansky, MD [facility] PHYSICAL       There are no Patient Instructions on file for this visit.  An after visit summary was provided for the patient either in written format (printed) or through [facility].  This note has been created using automated tools and reviewed for accuracy by HEIDI MARIE GRANDIS.   "

## 2024-07-12 ENCOUNTER — Emergency Department

## 2024-07-12 ENCOUNTER — Other Ambulatory Visit: Payer: Self-pay

## 2024-07-12 ENCOUNTER — Emergency Department: Admission: EM | Admit: 2024-07-12 | Discharge: 2024-07-12 | Disposition: A | Discharge status: Home / Self Care

## 2024-07-12 DIAGNOSIS — E119 Type 2 diabetes mellitus without complications: Secondary | ICD-10-CM | POA: Diagnosis not present

## 2024-07-12 DIAGNOSIS — T783XXA Angioneurotic edema, initial encounter: Secondary | ICD-10-CM | POA: Diagnosis not present

## 2024-07-12 DIAGNOSIS — K8050 Calculus of bile duct without cholangitis or cholecystitis without obstruction: Secondary | ICD-10-CM | POA: Insufficient documentation

## 2024-07-12 DIAGNOSIS — I10 Essential (primary) hypertension: Secondary | ICD-10-CM | POA: Insufficient documentation

## 2024-07-12 DIAGNOSIS — L03211 Cellulitis of face: Secondary | ICD-10-CM | POA: Insufficient documentation

## 2024-07-12 DIAGNOSIS — J329 Chronic sinusitis, unspecified: Secondary | ICD-10-CM | POA: Diagnosis not present

## 2024-07-12 DIAGNOSIS — R220 Localized swelling, mass and lump, head: Secondary | ICD-10-CM | POA: Diagnosis present

## 2024-07-12 LAB — BILIRUBIN, DIRECT: Bilirubin, Direct: 0.2 mg/dL (ref 0.0–0.2)

## 2024-07-12 LAB — CBC WITH DIFFERENTIAL/PLATELET
Abs Immature Granulocytes: 0.04 K/uL (ref 0.00–0.07)
Basophils Absolute: 0 K/uL (ref 0.0–0.1)
Basophils Relative: 1 %
Eosinophils Absolute: 0.5 K/uL (ref 0.0–0.5)
Eosinophils Relative: 7 %
HCT: 33.9 % — ABNORMAL LOW (ref 36.0–46.0)
Hemoglobin: 11.1 g/dL — ABNORMAL LOW (ref 12.0–15.0)
Immature Granulocytes: 1 %
Lymphocytes Relative: 13 %
Lymphs Abs: 0.9 K/uL (ref 0.7–4.0)
MCH: 29.3 pg (ref 26.0–34.0)
MCHC: 32.7 g/dL (ref 30.0–36.0)
MCV: 89.4 fL (ref 80.0–100.0)
Monocytes Absolute: 0.5 K/uL (ref 0.1–1.0)
Monocytes Relative: 7 %
Neutro Abs: 4.8 K/uL (ref 1.7–7.7)
Neutrophils Relative %: 71 %
Platelets: 232 K/uL (ref 150–400)
RBC: 3.79 MIL/uL — ABNORMAL LOW (ref 3.87–5.11)
RDW: 13.3 % (ref 11.5–15.5)
WBC: 6.7 K/uL (ref 4.0–10.5)
nRBC: 0 % (ref 0.0–0.2)

## 2024-07-12 LAB — LIPASE, BLOOD: Lipase: 22 U/L (ref 11–51)

## 2024-07-12 LAB — COMPREHENSIVE METABOLIC PANEL WITH GFR
ALT: 21 U/L (ref 0–44)
AST: 23 U/L (ref 15–41)
Albumin: 4 g/dL (ref 3.5–5.0)
Alkaline Phosphatase: 97 U/L (ref 38–126)
Anion gap: 10 (ref 5–15)
BUN: 6 mg/dL — ABNORMAL LOW (ref 8–23)
CO2: 25 mmol/L (ref 22–32)
Calcium: 9.1 mg/dL (ref 8.9–10.3)
Chloride: 107 mmol/L (ref 98–111)
Creatinine, Ser: 0.48 mg/dL (ref 0.44–1.00)
GFR, Estimated: >60 mL/min
Glucose, Bld: 89 mg/dL (ref 70–99)
Potassium: 3.9 mmol/L (ref 3.5–5.1)
Sodium: 142 mmol/L (ref 135–145)
Total Bilirubin: 0.3 mg/dL (ref 0.0–1.2)
Total Protein: 6.8 g/dL (ref 6.5–8.1)

## 2024-07-12 LAB — SEDIMENTATION RATE: Sed Rate: 24 mm/h (ref 0–30)

## 2024-07-12 LAB — C-REACTIVE PROTEIN: CRP: 1.7 mg/dL — ABNORMAL HIGH

## 2024-07-12 MED ORDER — METHYLPREDNISOLONE SODIUM SUCC 125 MG IJ SOLR
125.0000 mg | Freq: Once | INTRAMUSCULAR | Status: AC
  Administered 2024-07-12: 125 mg via INTRAVENOUS
  Filled 2024-07-12: qty 2

## 2024-07-12 MED ORDER — AMOXICILLIN-POT CLAVULANATE 875-125 MG PO TABS: 1.0000 | ORAL_TABLET | Freq: Two times a day (BID) | ORAL | 0 refills | Status: AC

## 2024-07-12 MED ORDER — PREDNISONE 20 MG PO TABS: 20.0000 mg | ORAL_TABLET | Freq: Two times a day (BID) | ORAL | 0 refills | Status: AC

## 2024-07-12 MED ORDER — SODIUM CHLORIDE 0.9 % IV BOLUS
1000.0000 mL | Freq: Once | INTRAVENOUS | Status: AC
  Administered 2024-07-12: 1000 mL via INTRAVENOUS

## 2024-07-12 MED ORDER — DIPHENHYDRAMINE HCL 50 MG/ML IJ SOLN
12.5000 mg | Freq: Once | INTRAMUSCULAR | Status: AC
  Administered 2024-07-12: 12.5 mg via INTRAVENOUS
  Filled 2024-07-12: qty 1

## 2024-07-12 MED ORDER — IOHEXOL 300 MG/ML  SOLN
100.0000 mL | Freq: Once | INTRAMUSCULAR | Status: AC | PRN
  Administered 2024-07-12: 100 mL via INTRAVENOUS

## 2024-07-12 MED ORDER — FAMOTIDINE IN NACL 20-0.9 MG/50ML-% IV SOLN
20.0000 mg | Freq: Once | INTRAVENOUS | Status: AC
  Administered 2024-07-12: 20 mg via INTRAVENOUS
  Filled 2024-07-12: qty 50

## 2024-07-12 MED ORDER — AMOXICILLIN-POT CLAVULANATE 875-125 MG PO TABS
1.0000 | ORAL_TABLET | Freq: Once | ORAL | Status: AC
  Administered 2024-07-12: 1 via ORAL
  Filled 2024-07-12: qty 1

## 2024-07-12 MED ORDER — FAMOTIDINE 20 MG PO TABS: 20.0000 mg | ORAL_TABLET | Freq: Every day | ORAL | 0 refills | Status: DC

## 2024-07-12 MED ORDER — EPINEPHRINE 0.3 MG/0.3ML IJ SOAJ: 0.3000 mg | INTRAMUSCULAR | 1 refills | Status: AC | PRN

## 2024-07-12 MED ORDER — DIPHENHYDRAMINE HCL 25 MG PO CAPS: 25.0000 mg | ORAL_CAPSULE | Freq: Four times a day (QID) | ORAL | 0 refills | Status: AC | PRN

## 2024-07-12 NOTE — Discharge Instructions (Addendum)
 Your next dose of steroids and Pepcid will be due tomorrow morning.  You can take another dose of Benadryl 6 hours for now.  Your next dose of Augmentin will be due this evening.  Please talk to your primary care physician about following up with an allergist again.  Please monitor your sugars and stick to a low sugar diet over the next 4 days.  If your sugar spikes above 450 please return to the emergency department to evaluate for syndrome called diabetic ketoacidosis.  Please return if any acutely worsening symptoms. -- RETURN PRECAUTIONS & AFTERCARE: (ENGLISH) RETURN PRECAUTIONS: Return immediately to the emergency department or see/call your doctor if you feel worse, weak or have changes in speech or vision, are short of breath, have fever, vomiting, pain, bleeding or dark stool, trouble urinating or any new issues. Return here or see/call your doctor if not improving as expected for your suspected condition. FOLLOW-UP CARE: Call your doctor and/or any doctors we referred you to for more advice and to make an appointment. Do this today, tomorrow or after the weekend. Some doctors only take PPO insurance so if you have HMO insurance you may want to contact your HMO or your regular doctor for referral to a specialist within your plan. Either way tell the doctor's office that it was a referral from the emergency department so you get the soonest possible appointment.  YOUR TEST RESULTS: Take result reports of any blood or urine tests, imaging tests and EKG's to your doctor and any referral doctor. Have any abnormal tests repeated. Your doctor or a referral doctor can let you know when this should be done. Also make sure your doctor contacts this hospital to get any test results that are not currently available such as cultures or special tests for infection and final imaging reports, which are often not available at the time you leave the ER but which may list additional important findings that are not  documented on the preliminary report. BLOOD PRESSURE: If your blood pressure was greater than 120/80 have your blood pressure rechecked within 1 to 2 weeks. MEDICATION SIDE EFFECTS: Do not drive, walk, bike, take the bus, etc. if you have received or are being prescribed any sedating medications such as those for pain or anxiety or certain antihistamines like Benadryl. If you have been give one of these here get a taxi home or have a friend drive you home. Ask your pharmacist to counsel you on potential side effects of any new medication

## 2024-07-12 NOTE — ED Triage Notes (Signed)
 C/O week long history C/O hand, arm, and feet swelling. Has seen PCP and was started on Atarax. Presents this morning with right lower lip and right lower facial swelling since 0430.  Denies SOB. Voice clear and strong.  C/O worsening sinus congestion x 3 days.

## 2024-07-12 NOTE — ED Notes (Signed)
 Pt returned from CT

## 2024-07-12 NOTE — ED Provider Notes (Signed)
 "  [facility] Provider Note    Event Date/Time   First MD Initiated Contact with Patient 07/12/24 [phone]     (approximate)   History   Facial Swelling   HPI  Lindsay Wallace is a 69 y.o. female GERD, depression, hypertension, type 2 diabetes, lichen sclerosis hypercholesterolemia and long history of allergic reactions which she manages with Zyrtec seen by her primary care physician 2 days ago on 07/10/2024 and prescribed, clobestolol ointment, Zyrtec, hydroxyzine.  Also seen in [facility] urgent care on 07/10/2023 where she had a lipase of 27 and a CMP with no elevated liver function tests or bilirubin.  Patient states that she has had intermittent right upper quadrant abdominal pain for the past week (none currently) and yesterday night despite taking all of her medications as prescribed developed right facial swelling and lip edema.  Denies any difficulty handling her secretions or shortness of breath.  She reports that her rash on her upper extremities has dissipated significantly since her primary care physician appointment.  She tells me that she has seen an allergist in the past and was told that she had food and environmental allergies but that is to the extent of what she recalls.  She is not had any fevers or chills cough congestion nausea vomiting or changes in urinary or bowel habits.  She presents with her husband who contributes to the history.  Notably she has not been prescribed steroids yet given her history of type 2 diabetes     Physical Exam   Triage Vital Signs: ED Triage Vitals [07/12/24 0722]  Encounter Vitals Group     BP (!) 147/78     Girls Systolic BP Percentile      Girls Diastolic BP Percentile      Boys Systolic BP Percentile      Boys Diastolic BP Percentile      Pulse Rate 72     Resp 16     Temp 98.6 F (37 C)     Temp Source Oral     SpO2 96 %     Weight 199 lb 1.2 oz (90.3 kg)     Height      Head Circumference      Peak  Flow      Pain Score 0     Pain Loc      Pain Education      Exclude from Growth Chart     Most recent vital signs: Vitals:   07/12/24 0722 07/12/24 1119  BP: (!) 147/78 134/72  Pulse: 72 87  Resp: 16 15  Temp: 98.6 F (37 C)   SpO2: 96% 100%    Nursing Triage Note reviewed. Vital signs reviewed and patients oxygen saturation is normoxic  General: Patient is well nourished, well developed, awake and alert, resting comfortably in no acute distress Head: Normocephalic and atraumatic Eyes: Normal inspection, extraocular muscles intact, no conjunctival pallor Ear, nose, throat: Right facial edema in the maxilla and right lower lip, no submandibular swelling, no neck edema or stridor.  No intraoral swelling    Neck: Normal range of motion Respiratory: Patient is in no respiratory distress, lungs CTAB Cardiovascular: Patient is not tachycardic, RRR without murmur appreciated GI: Abd Soft, mild ttp in RUQ and epigastrum with no guarding or rebound  Back: Normal inspection of the back with good strength and range of motion throughout all ext Extremities: pulses intact with good cap refills, no LE pitting edema or calf tenderness Neuro:  The patient is alert and oriented to person, place, and time, appropriately conversive, with 5/5 bilat UE/LE strength, no gross motor or sensory defects noted. Coordination appears to be adequate. Skin: Mild excoriations along upper arms and dorsum of hands which patient reports is improved, no erythema swelling.  She does have slight erythema of the left posterior finger but not consistent with cellulitis.    Psych: normal mood and affect, no SI or HI  ED Results / Procedures / Treatments   Labs (all labs ordered are listed, but only abnormal results are displayed) Labs Reviewed  CBC WITH DIFFERENTIAL/PLATELET - Abnormal; Notable for the following components:      Result Value   RBC 3.79 (*)    Hemoglobin 11.1 (*)    HCT 33.9 (*)    All  other components within normal limits  COMPREHENSIVE METABOLIC PANEL WITH GFR - Abnormal; Notable for the following components:   BUN 6 (*)    All other components within normal limits  C-REACTIVE PROTEIN - Abnormal; Notable for the following components:   CRP 1.7 (*)    All other components within normal limits  LIPASE, BLOOD  SEDIMENTATION RATE  BILIRUBIN, DIRECT  TRYPTASE     EKG None  RADIOLOGY US  RUQ CT Face:     PROCEDURES:  Critical Care performed: Yes, see critical care procedure note(s)  .Critical Care  Performed by: Nicholaus Rolland BRAVO, MD Authorized by: Nicholaus Rolland BRAVO, MD   Critical care provider statement:    Critical care time (minutes):  31   Critical care time was exclusive of:  Separately billable procedures and treating other patients   Critical care was time spent personally by me on the following activities:  Development of treatment plan with patient or surrogate, discussions with consultants, evaluation of patient's response to treatment, examination of patient, ordering and review of laboratory studies, ordering and review of radiographic studies, ordering and performing treatments and interventions, pulse oximetry, re-evaluation of patient's condition and review of old charts Comments:     Need for high-dose Solu-Medrol Benadryl, Pepcid and frequent reassessments in the setting of likely angioedema versus allergic reaction    MEDICATIONS ORDERED IN ED: Medications  methylPREDNISolone sodium succinate (SOLU-MEDROL) 125 mg/2 mL injection 125 mg (125 mg Intravenous Given 07/12/24 0802)  diphenhydrAMINE (BENADRYL) injection 12.5 mg (12.5 mg Intravenous Given 07/12/24 0802)  famotidine (PEPCID) IVPB 20 mg premix (0 mg Intravenous Stopped 07/12/24 0835)  sodium chloride 0.9 % bolus 1,000 mL (0 mLs Intravenous Stopped 07/12/24 0914)  iohexol (OMNIPAQUE) 300 MG/ML solution 100 mL (100 mLs Intravenous Contrast Given 07/12/24 0921)  amoxicillin-clavulanate  (AUGMENTIN) 875-125 MG per tablet 1 tablet (1 tablet Oral Given 07/12/24 1118)     IMPRESSION / MDM / ASSESSMENT AND PLAN / ED COURSE                                Differential diagnosis includes, but is not limited to, allergic reaction, angioedema, mast cell activation disease, sialodenitis, gallbladder disease  ED course: Patient presents with obvious right sided facial edema involving the right maxillary and right lower lip.  Have no concern for airway obstruction at this time.  Most concern for immune mediated pathology but given that patient has not had any improvement with Atarax and lotions will initiate IV Benadryl Solu-Medrol and Pepcid.   Clinical Course as of 07/12/24 1632  Fri Jul 12, 2024  9255 Patient reassessed and right  lower lip edema does seem to be worsening [HD]  0857 US  ABDOMEN LIMITED RUQ (LIVER/GB) Multiple small stones [HD]  0857 Bilirubin, Direct: 0.2 No elevation [HD]  0857 Sed Rate: 24 Reassuring [HD]  0857 CBC with Differential(!) No leukocytosis or acute anemia requiring transfusion [HD]  0857 Comprehensive metabolic panel(!) Unremarkable [HD]  [id number] Patient reassessed, still has right facial swelling however improved since interventions.  Floor of mouth remains soft and handling secretions.  We reviewed the results of her blood work and ultrasound.  Still awaiting the CT face however patient does seem to be moving in the correct direction [HD]  1049 Patient reassessed and lip swelling has improved however right maxilla still is enlarged.  CT scan did read this is postinjury facial cellulitis with underlying sinusitis.  Although most suspicion for allergic reaction, given the underlying sinusitis I cannot rule out an another cause.  Will start the patient on Augmentin which she has tolerated previously.  Will send the patient home with 7 days of Augmentin, 4 additional days of prednisone, Benadryl, EpiPen and Pepcid.  She will follow-up with her primary care  physician.  She does have an Accu-Chek for glucose at home and she will stick to a low sugar diet and will come back to emergency department if her glucose does spike above 450.  All questions answered and patient voiced understanding and requested discharge [HD]    Clinical Course User Index [HD] Nicholaus Rolland BRAVO, MD   At time of discharge there is no evidence of acute life, limb, vision, or fertility threat. Patient has stable vital signs, pain is well controlled, patient is ambulatory and p.o. tolerant.  Discharge instructions were completed using the EPIC system. I would refer you to those at this time. All warnings prescriptions follow-up etc. were discussed in detail with the patient. Patient indicates understanding and is agreeable with this plan. All questions answered.  Patient is made aware that they may return to the emergency department for any worsening or new condition or for any other emergency.   -- Risk: 5 This patient has a high risk of morbidity due to further diagnostic testing or treatment. Rationale: This patients evaluation and management involve a high risk of morbidity due to the potential severity of presenting symptoms, need for diagnostic testing, and/or initiation of treatment that may require close monitoring. The differential includes conditions with potential for significant deterioration or requiring escalation of care. Treatment decisions in the ED, including medication administration, procedural interventions, or disposition planning, reflect this level of risk. COPA: 5 The patient has the following acute or chronic illness/injury that poses a possible threat to life or bodily function: [X] : The patient has a potentially serious acute condition or an acute exacerbation of a chronic illness requiring urgent evaluation and management in the Emergency Department. The clinical presentation necessitates immediate consideration of life-threatening or function-threatening  diagnoses, even if they are ultimately ruled out.   FINAL CLINICAL IMPRESSION(S) / ED DIAGNOSES   Final diagnoses:  Angioedema, initial encounter  Facial cellulitis  Sinusitis, unspecified chronicity, unspecified location  Biliary colic     Rx / DC Orders   ED Discharge Orders          Ordered    predniSONE (DELTASONE) 20 MG tablet  2 times daily with meals        07/12/24 1054    famotidine (PEPCID) 20 MG tablet  Daily        07/12/24 1054    diphenhydrAMINE (BENADRYL ALLERGY)  25 mg capsule  Every 6 hours PRN        07/12/24 1054    EPINEPHrine (EPIPEN 2-PAK) 0.3 mg/0.3 mL IJ SOAJ injection  As needed        07/12/24 1054    amoxicillin-clavulanate (AUGMENTIN) 875-125 MG tablet  2 times daily        07/12/24 1055             Note:  This document was prepared using Dragon voice recognition software and may include unintentional dictation errors.   Nicholaus Rolland BRAVO, MD 07/12/24 [phone]  "

## 2024-07-12 NOTE — ED Notes (Signed)
 Pt to CT

## 2024-07-12 NOTE — ED Notes (Signed)
 Ultrasound at bedside

## 2024-07-14 LAB — TRYPTASE: Tryptase: 6.3 ug/L (ref 2.2–13.2)

## 2024-07-17 DIAGNOSIS — T464X5A Adverse effect of angiotensin-converting-enzyme inhibitors, initial encounter: Secondary | ICD-10-CM | POA: Insufficient documentation

## 2024-07-22 ENCOUNTER — Other Ambulatory Visit: Payer: Self-pay

## 2024-07-22 DIAGNOSIS — F32A Depression, unspecified: Secondary | ICD-10-CM

## 2024-07-22 DIAGNOSIS — K219 Gastro-esophageal reflux disease without esophagitis: Secondary | ICD-10-CM

## 2024-07-22 DIAGNOSIS — T7840XA Allergy, unspecified, initial encounter: Secondary | ICD-10-CM | POA: Insufficient documentation

## 2024-07-22 HISTORY — DX: Gastro-esophageal reflux disease without esophagitis: K21.9

## 2024-07-22 HISTORY — DX: Depression, unspecified: F32.A

## 2024-07-23 ENCOUNTER — Encounter: Payer: Self-pay | Admitting: Family Medicine

## 2024-07-23 ENCOUNTER — Ambulatory Visit: Admitting: Family Medicine

## 2024-07-23 VITALS — BP 118/71 | HR 94 | Temp 98.2°F | Ht 63.0 in | Wt 163.2 lb

## 2024-07-23 DIAGNOSIS — K219 Gastro-esophageal reflux disease without esophagitis: Secondary | ICD-10-CM | POA: Diagnosis not present

## 2024-07-23 DIAGNOSIS — K5909 Other constipation: Secondary | ICD-10-CM | POA: Diagnosis not present

## 2024-07-23 DIAGNOSIS — R1011 Right upper quadrant pain: Secondary | ICD-10-CM | POA: Diagnosis not present

## 2024-07-23 NOTE — Patient Instructions (Signed)
" ° °  Start OTC Benefiber Powder. Mix 1 - 2 Tablespoons in 6 - 8 ounces of a Drink Once Daily. Drink 64 ounces of water / fluids Daily.   For constipation: Start OTC Miralax  Powder Mix 1 capful in 6 to 8 ounces of a drink once daily  Recommend high-fiber diet, 30 g of fiber daily Eat fruits, vegetables, and whole grains  "

## 2024-07-26 ENCOUNTER — Other Ambulatory Visit: Admission: RE | Admit: 2024-07-26 | Source: Home / Self Care

## 2024-08-07 ENCOUNTER — Ambulatory Visit: Admit: 2024-08-07

## 2024-08-08 ENCOUNTER — Ambulatory Visit: Payer: PRIVATE HEALTH INSURANCE | Admitting: General Surgery

## 2024-08-20 ENCOUNTER — Ambulatory Visit: Admitting: Family Medicine

## 2024-12-10 ENCOUNTER — Ambulatory Visit: Admitting: Dermatology
